# Patient Record
Sex: Male | Born: 1980 | Race: Black or African American | Hispanic: No | Marital: Single | Smoking: Current some day smoker
Health system: Southern US, Community
[De-identification: ages and names within clinical notes are randomized; demographics above are authoritative.]

## PROBLEM LIST (undated history)

## (undated) DIAGNOSIS — I10 Essential (primary) hypertension: Secondary | ICD-10-CM

---

## 1999-03-22 ENCOUNTER — Emergency Department (HOSPITAL_COMMUNITY): Admission: EM | Admit: 1999-03-22 | Discharge: 1999-03-22 | Payer: Self-pay | Admitting: Emergency Medicine

## 1999-11-27 ENCOUNTER — Emergency Department (HOSPITAL_COMMUNITY): Admission: EM | Admit: 1999-11-27 | Discharge: 1999-11-27 | Payer: Self-pay | Admitting: *Deleted

## 2001-08-16 ENCOUNTER — Emergency Department (HOSPITAL_COMMUNITY): Admission: EM | Admit: 2001-08-16 | Discharge: 2001-08-16 | Payer: Self-pay | Admitting: Emergency Medicine

## 2003-08-17 ENCOUNTER — Emergency Department (HOSPITAL_COMMUNITY): Admission: EM | Admit: 2003-08-17 | Discharge: 2003-08-17 | Payer: Self-pay | Admitting: Emergency Medicine

## 2005-05-04 ENCOUNTER — Emergency Department (HOSPITAL_COMMUNITY): Admission: EM | Admit: 2005-05-04 | Discharge: 2005-05-05 | Payer: Self-pay | Admitting: Emergency Medicine

## 2008-03-07 ENCOUNTER — Emergency Department (HOSPITAL_COMMUNITY): Admission: EM | Admit: 2008-03-07 | Discharge: 2008-03-07 | Payer: Self-pay | Admitting: Emergency Medicine

## 2008-09-09 ENCOUNTER — Emergency Department (HOSPITAL_COMMUNITY): Admission: EM | Admit: 2008-09-09 | Discharge: 2008-09-10 | Payer: Self-pay | Admitting: Emergency Medicine

## 2008-09-15 ENCOUNTER — Emergency Department (HOSPITAL_COMMUNITY): Admission: EM | Admit: 2008-09-15 | Discharge: 2008-09-16 | Payer: Self-pay | Admitting: Emergency Medicine

## 2010-08-14 ENCOUNTER — Emergency Department (HOSPITAL_COMMUNITY): Admission: EM | Admit: 2010-08-14 | Discharge: 2010-08-14 | Payer: Self-pay | Admitting: Emergency Medicine

## 2010-12-18 ENCOUNTER — Emergency Department (HOSPITAL_COMMUNITY)
Admission: EM | Admit: 2010-12-18 | Discharge: 2010-12-18 | Payer: Self-pay | Source: Home / Self Care | Admitting: Emergency Medicine

## 2010-12-19 ENCOUNTER — Emergency Department (HOSPITAL_COMMUNITY)
Admission: EM | Admit: 2010-12-19 | Discharge: 2010-12-19 | Payer: Self-pay | Source: Home / Self Care | Admitting: Emergency Medicine

## 2011-01-03 ENCOUNTER — Emergency Department (HOSPITAL_COMMUNITY)
Admission: EM | Admit: 2011-01-03 | Discharge: 2011-01-03 | Payer: Self-pay | Source: Home / Self Care | Admitting: Emergency Medicine

## 2012-12-05 ENCOUNTER — Encounter (HOSPITAL_BASED_OUTPATIENT_CLINIC_OR_DEPARTMENT_OTHER): Payer: Self-pay | Admitting: Family Medicine

## 2012-12-05 ENCOUNTER — Emergency Department (HOSPITAL_BASED_OUTPATIENT_CLINIC_OR_DEPARTMENT_OTHER): Payer: Medicaid Other

## 2012-12-05 ENCOUNTER — Emergency Department (HOSPITAL_BASED_OUTPATIENT_CLINIC_OR_DEPARTMENT_OTHER)
Admission: EM | Admit: 2012-12-05 | Discharge: 2012-12-05 | Disposition: A | Discharge status: Home / Self Care | Payer: Medicaid Other | Attending: Emergency Medicine | Admitting: Emergency Medicine

## 2012-12-05 DIAGNOSIS — I10 Essential (primary) hypertension: Secondary | ICD-10-CM | POA: Insufficient documentation

## 2012-12-05 DIAGNOSIS — G629 Polyneuropathy, unspecified: Secondary | ICD-10-CM

## 2012-12-05 DIAGNOSIS — F17200 Nicotine dependence, unspecified, uncomplicated: Secondary | ICD-10-CM | POA: Insufficient documentation

## 2012-12-05 DIAGNOSIS — M62838 Other muscle spasm: Secondary | ICD-10-CM | POA: Insufficient documentation

## 2012-12-05 DIAGNOSIS — G589 Mononeuropathy, unspecified: Secondary | ICD-10-CM | POA: Insufficient documentation

## 2012-12-05 DIAGNOSIS — R209 Unspecified disturbances of skin sensation: Secondary | ICD-10-CM | POA: Insufficient documentation

## 2012-12-05 HISTORY — DX: Essential (primary) hypertension: I10

## 2012-12-05 MED ORDER — GABAPENTIN 100 MG PO CAPS: 100.0000 mg | ORAL_CAPSULE | Freq: Three times a day (TID) | ORAL | Status: DC

## 2012-12-05 MED ORDER — IBUPROFEN 600 MG PO TABS: 600.0000 mg | ORAL_TABLET | Freq: Four times a day (QID) | ORAL | Status: DC | PRN

## 2012-12-05 MED ORDER — IBUPROFEN 400 MG PO TABS
600.0000 mg | ORAL_TABLET | Freq: Once | ORAL | Status: AC
  Administered 2012-12-05: 600 mg via ORAL
  Filled 2012-12-05: qty 1

## 2012-12-05 MED ORDER — METHOCARBAMOL 500 MG PO TABS
500.0000 mg | ORAL_TABLET | Freq: Once | ORAL | Status: AC
  Administered 2012-12-05: 500 mg via ORAL
  Filled 2012-12-05: qty 1

## 2012-12-05 MED ORDER — HYDROCODONE-ACETAMINOPHEN 5-325 MG PO TABS: 1.0000 | ORAL_TABLET | Freq: Four times a day (QID) | ORAL | Status: DC | PRN

## 2012-12-05 MED ORDER — DEXAMETHASONE SODIUM PHOSPHATE 10 MG/ML IJ SOLN
10.0000 mg | Freq: Once | INTRAMUSCULAR | Status: AC
  Administered 2012-12-05: 10 mg via INTRAMUSCULAR
  Filled 2012-12-05: qty 1

## 2012-12-05 MED ORDER — HYDROCODONE-ACETAMINOPHEN 5-325 MG PO TABS
ORAL_TABLET | ORAL | Status: AC
  Filled 2012-12-05: qty 1

## 2012-12-05 MED ORDER — HYDROCODONE-ACETAMINOPHEN 5-325 MG PO TABS
2.0000 | ORAL_TABLET | Freq: Once | ORAL | Status: AC
  Administered 2012-12-05: 2 via ORAL
  Filled 2012-12-05: qty 1

## 2012-12-05 MED ORDER — METHOCARBAMOL 500 MG PO TABS: 500.0000 mg | ORAL_TABLET | Freq: Two times a day (BID) | ORAL | Status: DC

## 2012-12-05 NOTE — ED Notes (Addendum)
Pt c/o left lateral neck pain for "weeks" that feels like "spasm" and "muscle pain" with tingling radiating down left arm.  and h/o same in past. Denies injury. Pt reports h/o htn without meds. No PCP.

## 2012-12-05 NOTE — ED Provider Notes (Signed)
History     CSN: [id number]  Arrival date & time 12/05/12  0908   First MD Initiated Contact with Patient 12/05/12 1019      Chief Complaint  Patient presents with  . Neck Pain    (Consider location/radiation/quality/duration/timing/severity/associated sxs/prior treatment) HPI Comments: Pt comes in with cc of neck pain and numbness in his left upper extremity. States that he had similar symptoms last year, and he was given a shot, and got better for close to a year- but his symptoms started few weeks back. Pt has neck pain, mostly in the paraspinal region, scapular region and he has shooting pain in his LUE with some numbness - described as pins and needle sensation. No hx of recent trauma, although he does engage in strenuous workouts. Pt has no associated weakness.  Patient is a 31 y.o. male presenting with neck pain. The history is provided by the patient.  Neck Pain  Associated symptoms include numbness. Pertinent negatives include no chest pain and no headaches.    Past Medical History  Diagnosis Date  . Hypertension     History reviewed. No pertinent past surgical history.  No family history on file.  History  Substance Use Topics  . Smoking status: Current Some Day Smoker  . Smokeless tobacco: Not on file  . Alcohol Use: Yes      Review of Systems  Constitutional: Negative for fever, chills and activity change.  HENT: Positive for neck pain.   Eyes: Negative for visual disturbance.  Respiratory: Negative for cough, chest tightness and shortness of breath.   Cardiovascular: Negative for chest pain.  Gastrointestinal: Negative for abdominal distention.  Genitourinary: Negative for dysuria, enuresis and difficulty urinating.  Musculoskeletal: Negative for arthralgias.  Neurological: Positive for numbness. Negative for dizziness, light-headedness and headaches.  Psychiatric/Behavioral: Negative for confusion.    Allergies  Review of patient's allergies  indicates no known allergies.  Home Medications  No current outpatient prescriptions on file.  BP 158/107  Pulse 89  Temp 98 F (36.7 C) (Oral)  Resp 18  Ht 5\' 8"  (1.727 m)  Wt 170 lb (77.111 kg)  BMI 25.85 kg/m2  SpO2 100%  Physical Exam  Constitutional: He is oriented to person, place, and time. He appears well-developed.  HENT:  Head: Normocephalic and atraumatic.  Eyes: Conjunctivae normal and EOM are normal. Pupils are equal, round, and reactive to light.  Neck: Normal range of motion. Neck supple.  Cardiovascular: Normal rate, regular rhythm and normal heart sounds.   Pulmonary/Chest: Effort normal and breath sounds normal. No respiratory distress. He has no wheezes.  Abdominal: Soft. Bowel sounds are normal. He exhibits no distension. There is no tenderness. There is no rebound and no guarding.  Neurological: He is alert and oriented to person, place, and time. No cranial nerve deficit. Coordination normal.       Sensory exam normal for bilateral upper and lower extremities - and patient is able to discriminate between sharp and dull - however, he has more subjective numbness the entire hand and also the forearm and arm. Motor exam is 4+/5  Skin: Skin is warm.    ED Course  Procedures (including critical care time)  Labs Reviewed - No data to display Ct Cervical Spine Wo Contrast  12/05/2012  *RADIOLOGY REPORT*  Clinical Data: Neck pain and left arm pain and numbness.  CT CERVICAL SPINE WITHOUT CONTRAST  Technique:  Multidetector CT imaging of the cervical spine was performed. Multiplanar CT image reconstructions were also  generated.  Comparison: Cervical spine CT 09/10/2008.  Findings: The sagittal reformatted images demonstrate normal alignment of the cervical vertebral bodies.  Disc spaces are maintained.  The facets are normally aligned.  No facet or laminar fractures.  No abnormal prevertebral soft tissue swelling.  No obvious large disc protrusions, spinal or  foraminal stenosis.  The lung apices are clear.  IMPRESSION: Unremarkable cervical spine CT scan.   Original Report Authenticated By: Rudie Meyer, M.D.      No diagnosis found.    MDM  Pt comes in with cc of neck pain and associated numbness in his left hand. He is right handed. No preceding trauma, no hx of neck injuries.  Initial impression is that patient is having nerve impingement. Unsure where the impingement is..... And we will get Ct scpine to ensure there is no sever stenosis of the neck. Pt has some neck spasms as well, which we will treat.  If no stenosis, Neuro follow up for possible nerve conduction study.   Derwood Kaplan, MD 12/05/12 1240

## 2012-12-25 ENCOUNTER — Encounter: Payer: Self-pay | Admitting: Neurology

## 2012-12-25 ENCOUNTER — Ambulatory Visit (INDEPENDENT_AMBULATORY_CARE_PROVIDER_SITE_OTHER): Payer: Medicaid Other | Admitting: Neurology

## 2012-12-25 VITALS — BP 134/90 | HR 70 | Temp 98.1°F | Resp 14 | Ht 68.0 in | Wt 167.0 lb

## 2012-12-25 DIAGNOSIS — M5412 Radiculopathy, cervical region: Secondary | ICD-10-CM | POA: Insufficient documentation

## 2012-12-25 MED ORDER — METHYLPREDNISOLONE 4 MG PO KIT: PACK | ORAL | Status: DC

## 2012-12-25 NOTE — Patient Instructions (Addendum)
Call us in about 10 days and let us know how you are doing.   [phone]
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
## 2013-01-01 ENCOUNTER — Telehealth: Payer: Self-pay | Admitting: Neurology

## 2013-01-01 NOTE — Telephone Encounter (Signed)
The patient left voicemail on office phone stating he had received a telephone call from our office.  Please call the patient at [phone].

## 2013-01-01 NOTE — Telephone Encounter (Signed)
See phone note dated 01/01/13

## 2013-01-01 NOTE — Telephone Encounter (Signed)
Cira Servant., MD - neck pain arm pain More Detail >>     Sent: Wed January 01, 2013  1:27 PM    To: Benay Spice, RN       Pt Home: [phone]        Message     I tried calling cell phone and left a message.         Could you follow up with inquiry as to how are the symptoms doing with the medrol dose pack?         We can follow up if needed and/or try PT if indicated      Livia Snellen 01/01/2013 1:29 PM Signed  The patient left voicemail on office phone stating he had received a telephone call from our office. Please call the patient at [phone].  I left a message for the patient to return my call.  jhs 01/01/13 @ 1440

## 2013-01-06 NOTE — Telephone Encounter (Signed)
Called and spoke with the patient. He reports that the steroids "dulled the pain it a little." I suggested a follow up appointment and/or PT and the patient is interested in trying therapy. I told him I would check with Dr. Smiley Houseman and see what he recommended and get back with him with that information. The patient is ok with this plan. **Dr. Smiley Houseman, please advise therapy recommendations. Thanks.

## 2013-01-08 ENCOUNTER — Other Ambulatory Visit: Payer: Self-pay | Admitting: Neurology

## 2013-01-08 DIAGNOSIS — M79602 Pain in left arm: Secondary | ICD-10-CM

## 2013-01-08 DIAGNOSIS — M5412 Radiculopathy, cervical region: Secondary | ICD-10-CM

## 2013-01-08 NOTE — Telephone Encounter (Signed)
Lets recommend physical therapy twice a week for 3 to 4 weeks, Then 1 month follow up.

## 2013-01-08 NOTE — Telephone Encounter (Signed)
Called the patient. Aware of referral to PT and that they will call him to schedule appointment.

## 2013-01-17 ENCOUNTER — Ambulatory Visit: Payer: Medicaid Other

## 2013-01-17 ENCOUNTER — Ambulatory Visit: Payer: Medicaid Other | Admitting: Physical Therapy

## 2013-01-24 ENCOUNTER — Ambulatory Visit: Payer: Medicaid Other | Attending: Neurology | Admitting: Physical Therapy

## 2013-01-24 DIAGNOSIS — M25519 Pain in unspecified shoulder: Secondary | ICD-10-CM | POA: Insufficient documentation

## 2013-01-24 DIAGNOSIS — M542 Cervicalgia: Secondary | ICD-10-CM | POA: Insufficient documentation

## 2013-01-24 DIAGNOSIS — IMO0001 Reserved for inherently not codable concepts without codable children: Secondary | ICD-10-CM | POA: Insufficient documentation

## 2013-01-29 ENCOUNTER — Ambulatory Visit: Payer: Medicaid Other | Admitting: Physical Therapy

## 2013-02-03 ENCOUNTER — Ambulatory Visit: Payer: Medicaid Other | Admitting: Rehabilitative and Restorative Service Providers"

## 2013-02-17 ENCOUNTER — Ambulatory Visit: Payer: Medicaid Other | Admitting: Physical Therapy

## 2013-02-18 ENCOUNTER — Ambulatory Visit: Payer: Medicaid Other | Attending: Neurology | Admitting: Physical Therapy

## 2013-02-18 DIAGNOSIS — M542 Cervicalgia: Secondary | ICD-10-CM | POA: Insufficient documentation

## 2013-02-18 DIAGNOSIS — M25519 Pain in unspecified shoulder: Secondary | ICD-10-CM | POA: Insufficient documentation

## 2013-02-18 DIAGNOSIS — IMO0001 Reserved for inherently not codable concepts without codable children: Secondary | ICD-10-CM | POA: Insufficient documentation

## 2013-02-24 ENCOUNTER — Ambulatory Visit: Payer: Medicaid Other | Admitting: Physical Therapy

## 2013-03-05 ENCOUNTER — Ambulatory Visit: Payer: Medicaid Other | Admitting: Physical Therapy

## 2013-11-24 ENCOUNTER — Encounter (HOSPITAL_BASED_OUTPATIENT_CLINIC_OR_DEPARTMENT_OTHER): Payer: Self-pay | Admitting: Emergency Medicine

## 2013-11-24 ENCOUNTER — Emergency Department (HOSPITAL_BASED_OUTPATIENT_CLINIC_OR_DEPARTMENT_OTHER)
Admission: EM | Admit: 2013-11-24 | Discharge: 2013-11-24 | Disposition: A | Discharge status: Home / Self Care | Payer: Medicaid Other | Attending: Emergency Medicine | Admitting: Emergency Medicine

## 2013-11-24 ENCOUNTER — Emergency Department (HOSPITAL_BASED_OUTPATIENT_CLINIC_OR_DEPARTMENT_OTHER): Payer: Medicaid Other

## 2013-11-24 DIAGNOSIS — IMO0002 Reserved for concepts with insufficient information to code with codable children: Secondary | ICD-10-CM | POA: Insufficient documentation

## 2013-11-24 DIAGNOSIS — Z79899 Other long term (current) drug therapy: Secondary | ICD-10-CM | POA: Insufficient documentation

## 2013-11-24 DIAGNOSIS — I10 Essential (primary) hypertension: Secondary | ICD-10-CM | POA: Insufficient documentation

## 2013-11-24 DIAGNOSIS — M549 Dorsalgia, unspecified: Secondary | ICD-10-CM | POA: Insufficient documentation

## 2013-11-24 DIAGNOSIS — F17200 Nicotine dependence, unspecified, uncomplicated: Secondary | ICD-10-CM | POA: Insufficient documentation

## 2013-11-24 MED ORDER — CYCLOBENZAPRINE HCL 5 MG PO TABS: 5.0000 mg | ORAL_TABLET | Freq: Two times a day (BID) | ORAL | Status: DC | PRN

## 2013-11-24 MED ORDER — HYDROCODONE-ACETAMINOPHEN 5-325 MG PO TABS
1.0000 | ORAL_TABLET | Freq: Once | ORAL | Status: AC
  Administered 2013-11-24: 1 via ORAL
  Filled 2013-11-24: qty 1

## 2013-11-24 MED ORDER — CYCLOBENZAPRINE HCL 10 MG PO TABS
5.0000 mg | ORAL_TABLET | Freq: Once | ORAL | Status: AC
  Administered 2013-11-24: 5 mg via ORAL
  Filled 2013-11-24: qty 1

## 2013-11-24 MED ORDER — HYDROCODONE-ACETAMINOPHEN 5-325 MG PO TABS: 1.0000 | ORAL_TABLET | ORAL | Status: DC | PRN

## 2013-11-24 NOTE — ED Notes (Signed)
Pt rpeorts back pain that started this am.  Pain is described as sharp, increases with breathing and located "under shoulder blade" radiating to left chest wall.

## 2013-11-24 NOTE — ED Provider Notes (Signed)
CSN: [id number]     Arrival date & time 11/24/13  1021 History   First MD Initiated Contact with Patient 11/24/13 1035     Chief Complaint  Patient presents with  . Back Pain   (Consider location/radiation/quality/duration/timing/severity/associated sxs/prior Treatment) HPI Comments: Pt states that he was at work and she started having pain on his left shoulder blade:no numbness or weakness:pt states that he has a history of muscle spasms and this feels similar:no recent know injury  Patient is a 32 y.o. male presenting with back pain. The history is provided by the patient. No language interpreter was used.  Back Pain Location:  Thoracic spine Quality:  Aching Radiates to:  Does not radiate Pain severity:  Moderate Pain is:  Same all the time Onset quality:  Sudden Timing:  Constant   Past Medical History  Diagnosis Date  . Hypertension    History reviewed. No pertinent past surgical history. No family history on file. History  Substance Use Topics  . Smoking status: Current Some Day Smoker  . Smokeless tobacco: Never Used     Comment: social smoker  . Alcohol Use: Yes     Comment: 2-3 times a week    Review of Systems  Constitutional: Negative.   Respiratory: Negative.   Cardiovascular: Negative.   Musculoskeletal: Positive for back pain.    Allergies  Review of patient's allergies indicates no known allergies.  Home Medications   Current Outpatient Rx  Name  Route  Sig  Dispense  Refill  . gabapentin (NEURONTIN) 100 MG capsule   Oral   Take 1 capsule (100 mg total) by mouth 3 (three) times daily.   60 capsule   0   . HYDROcodone-acetaminophen (NORCO/VICODIN) 5-325 MG per tablet   Oral   Take 1 tablet by mouth every 6 (six) hours as needed for pain (severe pain only).   15 tablet   0   . ibuprofen (ADVIL,MOTRIN) 600 MG tablet   Oral   Take 1 tablet (600 mg total) by mouth every 6 (six) hours as needed for pain.   30 tablet   0   . methocarbamol  (ROBAXIN) 500 MG tablet   Oral   Take 1 tablet (500 mg total) by mouth 2 (two) times daily.   20 tablet   0   . methylPREDNISolone (MEDROL DOSEPAK) 4 MG tablet      follow package directions   21 tablet   0    BP 162/102  Pulse 71  Temp(Src) 98.1 F (36.7 C) (Oral)  Resp 16  SpO2 100% Physical Exam  Nursing note and vitals reviewed. Constitutional: He is oriented to person, place, and time. He appears well-developed and well-nourished.  Cardiovascular: Normal rate and regular rhythm.   Pulmonary/Chest: Effort normal and breath sounds normal.  Musculoskeletal: Normal range of motion.  Pt tender under the left shoulder blade:no deformity noted  Neurological: He is oriented to person, place, and time. Coordination normal.  Skin: Skin is warm and dry.    ED Course  Procedures (including critical care time) Labs Review Labs Reviewed - No data to display Imaging Review Dg Chest 2 View  11/24/2013   CLINICAL DATA:  Chest pain, smoker  EXAM: CHEST  2 VIEW  COMPARISON:  09/10/2008  FINDINGS: Cardiomediastinal silhouette is stable. No acute infiltrate or pleural effusion. No pulmonary edema. Bony thorax is unremarkable.  IMPRESSION: No active cardiopulmonary disease.   Electronically Signed   By: Natasha Mead M.D.   On:  11/24/2013 11:05    EKG Interpretation   None       MDM   1. Back pain    No injury:likely musculoskeletal in nature:will treat with vicodin and flexeril   Teressa Lower, NP 11/24/13 1207

## 2013-11-24 NOTE — ED Provider Notes (Signed)
Medical screening examination/treatment/procedure(s) were performed by non-physician practitioner and as supervising physician I was immediately available for consultation/collaboration.  EKG Interpretation   None         Candyce Churn, MD 11/24/13 1410

## 2016-08-08 ENCOUNTER — Emergency Department (HOSPITAL_COMMUNITY)
Admission: EM | Admit: 2016-08-08 | Discharge: 2016-08-08 | Disposition: A | Discharge status: Home / Self Care | Payer: Medicaid Other | Source: Home / Self Care

## 2016-08-08 ENCOUNTER — Encounter (HOSPITAL_BASED_OUTPATIENT_CLINIC_OR_DEPARTMENT_OTHER): Payer: Self-pay

## 2016-08-08 ENCOUNTER — Emergency Department (HOSPITAL_BASED_OUTPATIENT_CLINIC_OR_DEPARTMENT_OTHER): Payer: Medicaid Other

## 2016-08-08 ENCOUNTER — Emergency Department (HOSPITAL_BASED_OUTPATIENT_CLINIC_OR_DEPARTMENT_OTHER)
Admission: EM | Admit: 2016-08-08 | Discharge: 2016-08-09 | Disposition: A | Discharge status: Home / Self Care | Payer: Medicaid Other | Attending: Physician Assistant | Admitting: Physician Assistant

## 2016-08-08 ENCOUNTER — Encounter (HOSPITAL_COMMUNITY): Payer: Self-pay

## 2016-08-08 DIAGNOSIS — Z5321 Procedure and treatment not carried out due to patient leaving prior to being seen by health care provider: Secondary | ICD-10-CM

## 2016-08-08 DIAGNOSIS — F17200 Nicotine dependence, unspecified, uncomplicated: Secondary | ICD-10-CM

## 2016-08-08 DIAGNOSIS — R600 Localized edema: Secondary | ICD-10-CM | POA: Insufficient documentation

## 2016-08-08 DIAGNOSIS — I10 Essential (primary) hypertension: Secondary | ICD-10-CM

## 2016-08-08 DIAGNOSIS — R609 Edema, unspecified: Secondary | ICD-10-CM

## 2016-08-08 DIAGNOSIS — M7989 Other specified soft tissue disorders: Secondary | ICD-10-CM

## 2016-08-08 MED ORDER — IBUPROFEN 400 MG PO TABS
400.0000 mg | ORAL_TABLET | Freq: Once | ORAL | Status: AC | PRN
  Administered 2016-08-08: 400 mg via ORAL

## 2016-08-08 MED ORDER — OXYCODONE-ACETAMINOPHEN 5-325 MG PO TABS: 1.0000 | ORAL_TABLET | Freq: Four times a day (QID) | ORAL | 0 refills | Status: DC | PRN

## 2016-08-08 MED ORDER — CEPHALEXIN 500 MG PO CAPS: 500.0000 mg | ORAL_CAPSULE | Freq: Four times a day (QID) | ORAL | 0 refills | Status: DC

## 2016-08-08 MED ORDER — IBUPROFEN 400 MG PO TABS
ORAL_TABLET | ORAL | Status: AC
  Filled 2016-08-08: qty 1

## 2016-08-08 MED ORDER — OXYCODONE-ACETAMINOPHEN 5-325 MG PO TABS
1.0000 | ORAL_TABLET | Freq: Once | ORAL | Status: AC
  Administered 2016-08-08: 1 via ORAL
  Filled 2016-08-08: qty 1

## 2016-08-08 NOTE — Discharge Instructions (Signed)
Please establish care with a primary care physician. You have been given the phone number in the paperwork to help you with finding a physician. You can also call [facility] at [phone].   We are unsure what is causing your edema. We will need you to raise both her legs and rest. If the redness gets worse  please start to take antibiotics that were prescribed. It could be a superficial infection in the skin, though likely it is just from standing for long periods. We want you to follow up with a aPCP as soon as possible.

## 2016-08-08 NOTE — ED Provider Notes (Signed)
[facility] Provider Note   CSN: [id number] Arrival date & time: 08/08/16  [id number] By signing my name below, I, Bridgette HabermannMaria Tan, attest that this documentation has been prepared under the direction and in the presence of Alejandria Wessells Randall AnLyn Ulices Maack, MD. Electronically Signed: Bridgette HabermannMaria Tan, ED Scribe. 08/08/16. 7:40 PM.  History   Chief Complaint Chief Complaint  Patient presents with  . Leg Swelling   HPI Comments: Glenn Weber is a 35 y.o. male with h/o HTN who presents to the Emergency Department complaining of sudden onset, constant right lower extremity pain and swelling onset 5 days ago. Pt states he was working at a [employer] earlier this week and he was standing for long periods of time. He notes there is discoloration and states pain is exacerbated with ambulating and standing. Pt reports he could "feel the fluid". No alleviating factors tried PTA. Denies fever.   The history is provided by the patient. No language interpreter was used.    Past Medical History:  Diagnosis Date  . Hypertension     Patient Active Problem List   Diagnosis Date Noted  . Cervical radiculopathy at C7 12/25/2012    History reviewed. No pertinent surgical history.   Home Medications    Prior to Admission medications   Medication Sig Start Date End Date Taking? Authorizing Provider  cephALEXin (KEFLEX) 500 MG capsule Take 1 capsule (500 mg total) by mouth 4 (four) times daily. 08/08/16   Ojas Coone Lyn Vance Hochmuth, MD  cyclobenzaprine (FLEXERIL) 5 MG tablet Take 1 tablet (5 mg total) by mouth 2 (two) times daily as needed for muscle spasms. 11/24/13   Teressa LowerVrinda Pickering, NP  gabapentin (NEURONTIN) 100 MG capsule Take 1 capsule (100 mg total) by mouth 3 (three) times daily. 12/05/12   Derwood KaplanAnkit Nanavati, MD  HYDROcodone-acetaminophen (NORCO/VICODIN) 5-325 MG per tablet Take 1 tablet by mouth every 6 (six) hours as needed for pain (severe pain only). 12/05/12   Derwood KaplanAnkit Nanavati, MD    HYDROcodone-acetaminophen (NORCO/VICODIN) 5-325 MG per tablet Take 1-2 tablets by mouth every 4 (four) hours as needed. 11/24/13   Teressa LowerVrinda Pickering, NP  ibuprofen (ADVIL,MOTRIN) 600 MG tablet Take 1 tablet (600 mg total) by mouth every 6 (six) hours as needed for pain. 12/05/12   Derwood KaplanAnkit Nanavati, MD  methocarbamol (ROBAXIN) 500 MG tablet Take 1 tablet (500 mg total) by mouth 2 (two) times daily. 12/05/12   Derwood KaplanAnkit Nanavati, MD  methylPREDNISolone (MEDROL DOSEPAK) 4 MG tablet follow package directions 12/25/12   Cira ServantMichael L Soo, MD    Family History No family history on file.  Social History Social History  Substance Use Topics  . Smoking status: Current Some Day Smoker  . Smokeless tobacco: Never Used     Comment: social smoker  . Alcohol use Yes     Comment: 2-3 times a week     Allergies   Review of patient's allergies indicates no known allergies.   Review of Systems Review of Systems  Constitutional: Negative for fever.  Cardiovascular: Positive for leg swelling.  Skin: Positive for color change.  All other systems reviewed and are negative.  Physical Exam Updated Vital Signs BP (!) 157/107 (BP Location: Left Arm)   Pulse 82   Temp 98.5 F (36.9 C) (Oral)   Resp 18   Ht 5\' 8"  (1.727 m)   Wt 175 lb (79.4 kg)   SpO2 98%   BMI 26.61 kg/m   Physical Exam  Constitutional: He appears well-developed and well-nourished.  HENT:  Head: Normocephalic.  Eyes: Conjunctivae are normal.  Cardiovascular: Normal rate, regular rhythm and normal heart sounds.  Exam reveals no gallop and no friction rub.   No murmur heard. Pulmonary/Chest: Effort normal and breath sounds normal. No respiratory distress. He has no wheezes. He has no rales.  Abdominal: He exhibits no distension.  Musculoskeletal: Normal range of motion. He exhibits edema and tenderness.  Mild swelling to the right ankle with mild erythema to the lateral aspect of his right ankle and foot. No joint pain with pass over  active motion.   Neurological: He is alert.  Skin: Skin is warm and dry.  Psychiatric: He has a normal mood and affect. His behavior is normal.  Nursing note and vitals reviewed.  ED Treatments / Results  DIAGNOSTIC STUDIES: Oxygen Saturation is 100% on RA, normal by my interpretation.    COORDINATION OF CARE: 7:39 PM Discussed treatment plan with pt at bedside which includes ultrasound and x-ray and pt agreed to plan.  Labs (all labs ordered are listed, but only abnormal results are displayed) Labs Reviewed - No data to display  EKG  EKG Interpretation None       Radiology Dg Ankle 2 Views Right  Result Date: 08/08/2016 CLINICAL DATA:  Right ankle pain with swelling and redness. Symptoms for 3 days. No known injury. EXAM: RIGHT ANKLE - 2 VIEW COMPARISON:  None. FINDINGS: There is no evidence of fracture, dislocation, or joint effusion. There is no evidence of arthropathy or other focal bone abnormality. There is diffuse soft tissue edema. IMPRESSION: Diffuse soft tissue edema.  No osseous abnormality. Electronically Signed   By: Rubye Oaks M.D.   On: 08/08/2016 21:05   US Venous Img Lower Bilateral  Result Date: 08/08/2016 CLINICAL DATA:  Bilateral leg swelling and mild erythema. EXAM: BILATERAL LOWER EXTREMITY VENOUS DOPPLER ULTRASOUND TECHNIQUE: Gray-scale sonography with graded compression, as well as color Doppler and duplex ultrasound were performed to evaluate the lower extremity deep venous systems from the level of the common femoral vein and including the common femoral, femoral, profunda femoral, popliteal and calf veins including the posterior tibial, peroneal and gastrocnemius veins when visible. The superficial great saphenous vein was also interrogated. Spectral Doppler was utilized to evaluate flow at rest and with distal augmentation maneuvers in the common femoral, femoral and popliteal veins. COMPARISON:  None. FINDINGS: RIGHT LOWER EXTREMITY Common Femoral  Vein: No evidence of thrombus. Normal compressibility, respiratory phasicity and response to augmentation. Saphenofemoral Junction: No evidence of thrombus. Profunda Femoral Vein: No evidence of thrombus. Femoral Vein: No evidence of thrombus. Popliteal Vein: No evidence of thrombus. Calf Veins: No evidence of thrombus. Superficial Great Saphenous Vein: No evidence of thrombus. LEFT LOWER EXTREMITY Common Femoral Vein: No evidence of thrombus. Saphenofemoral Junction: No evidence of thrombus. Profunda Femoral Vein: No evidence of thrombus. Femoral Vein: No evidence of thrombus. Popliteal Vein: No evidence of thrombus. Calf Veins: No evidence of thrombus. Superficial Great Saphenous Vein: No evidence of thrombus. IMPRESSION: Negative for deep venous thrombosis in both lower extremities. Electronically Signed   By: Marnee Spring M.D.   On: 08/08/2016 22:10    Procedures Procedures (including critical care time)  Medications Ordered in ED Medications  oxyCODONE-acetaminophen (PERCOCET/ROXICET) 5-325 MG per tablet 1 tablet (1 tablet Oral Given 08/08/16 2013)     Initial Impression / Assessment and Plan / ED Course  I have reviewed the triage vital signs and the nursing notes.  Pertinent labs & imaging results that were available during  my care of the patient were reviewed by me and considered in my medical decision making (see chart for details).  Clinical Course   Patient is a healthy 35 year old African American male presenting with leg swelling. Patient has been working an event where he stood a lot, over 12 hours a day for the last 3 days. Today he found his legs to be very swollen. He also had some mild erythema and warmth to the right foot. We will get US to make sure there is no clot. However suspect this is likely due to venous insufficiency from standing and heat. Doubt setpic arthritis- no pain with movement of joint.  Doubt gout from visualization and no pain in joint. Could be beginning  on cellulitis in the right foot. No SOB or rales on exam and the swelling improves with raising the feet so doubt cardiac etiology.    10:42 PM Negative findings. Will give watch and wait rx for cellulitis. Only mild erythema.  Patient insturcted to start abx if rest and elevation does not compeltely resolve symptoms.    Final Clinical Impressions(s) / ED Diagnoses   Final diagnoses:  Peripheral edema    New Prescriptions New Prescriptions   CEPHALEXIN (KEFLEX) 500 MG CAPSULE    Take 1 capsule (500 mg total) by mouth 4 (four) times daily.  I personally performed the services described in this documentation, which was scribed in my presence. The recorded information has been reviewed and is accurate.       Sheniah Supak Randall AnLyn Jari Carollo, MD 08/08/16 [id number]

## 2016-08-08 NOTE — ED Triage Notes (Signed)
Pt states he wants to leave at this time. RN advised pt of risks of leaving. Pt understands and wishes to leave.

## 2016-08-08 NOTE — ED Triage Notes (Signed)
Pt worked the [employer], onset 4 nights ago bilateral feet started hurting, swelling and now right foot is red on top of foot and lateral side of foot and lower leg.  Pt reports left leg swollen too but not noticeable.

## 2016-08-08 NOTE — ED Triage Notes (Signed)
C/o bilat LE swelling x 5 days-NAD-presents to tri[facility] in a [facility]

## 2016-08-16 ENCOUNTER — Inpatient Hospital Stay: Payer: Medicaid Other | Admitting: Internal Medicine

## 2018-11-23 ENCOUNTER — Emergency Department (HOSPITAL_BASED_OUTPATIENT_CLINIC_OR_DEPARTMENT_OTHER): Payer: Medicaid Other

## 2018-11-23 ENCOUNTER — Emergency Department (HOSPITAL_BASED_OUTPATIENT_CLINIC_OR_DEPARTMENT_OTHER)
Admission: EM | Admit: 2018-11-23 | Discharge: 2018-11-23 | Disposition: A | Discharge status: Home / Self Care | Payer: Medicaid Other | Attending: Emergency Medicine | Admitting: Emergency Medicine

## 2018-11-23 ENCOUNTER — Other Ambulatory Visit: Payer: Self-pay

## 2018-11-23 ENCOUNTER — Encounter (HOSPITAL_BASED_OUTPATIENT_CLINIC_OR_DEPARTMENT_OTHER): Payer: Self-pay | Admitting: *Deleted

## 2018-11-23 DIAGNOSIS — Z79899 Other long term (current) drug therapy: Secondary | ICD-10-CM | POA: Insufficient documentation

## 2018-11-23 DIAGNOSIS — J069 Acute upper respiratory infection, unspecified: Secondary | ICD-10-CM

## 2018-11-23 DIAGNOSIS — F17210 Nicotine dependence, cigarettes, uncomplicated: Secondary | ICD-10-CM | POA: Insufficient documentation

## 2018-11-23 DIAGNOSIS — I10 Essential (primary) hypertension: Secondary | ICD-10-CM | POA: Diagnosis not present

## 2018-11-23 DIAGNOSIS — R0981 Nasal congestion: Secondary | ICD-10-CM | POA: Diagnosis present

## 2018-11-23 DIAGNOSIS — B9789 Other viral agents as the cause of diseases classified elsewhere: Secondary | ICD-10-CM | POA: Diagnosis not present

## 2018-11-23 MED ORDER — FLUTICASONE PROPIONATE 50 MCG/ACT NA SUSP: 1.0000 | Freq: Every day | NASAL | 0 refills | Status: DC

## 2018-11-23 MED ORDER — NAPROXEN 500 MG PO TABS: 500.0000 mg | ORAL_TABLET | Freq: Two times a day (BID) | ORAL | 0 refills | Status: DC

## 2018-11-23 MED ORDER — BENZONATATE 100 MG PO CAPS: 100.0000 mg | ORAL_CAPSULE | Freq: Three times a day (TID) | ORAL | 0 refills | Status: DC

## 2018-11-23 NOTE — Discharge Instructions (Signed)
You were seen in the emergency today for upper respiratory symptoms, we suspect your symptoms are related to allergies or a virus at this time. Your chest xray did not show pneumonia, it did have some changes related to smoking, please try to quit or cut down on tobacco use. I have prescribed you multiple medications to treat your symptoms.   -Flonase to be used 1 spray in each nostril daily.  This medication is used to treat your congestion.  -Tessalon can be taken once every 8 hours as needed.  This medication is used to treat your cough.  - Naproxen is a nonsteroidal anti-inflammatory medication that will help with pain and swelling. Be sure to take this medication as prescribed with food, 1 pill every 12 hours,  It should be taken with food, as it can cause stomach upset, and more seriously, stomach bleeding. Do not take other nonsteroidal anti-inflammatory medications with this such as Advil, Motrin, Aleve, Mobic, Goodie Powder, or Motrin.    You make take Tylenol per over the counter dosing with these medications.   We have prescribed you new medication(s) today. Discuss the medications prescribed today with your pharmacist as they can have adverse effects and interactions with your other medicines including over the counter and prescribed medications. Seek medical evaluation if you start to experience new or abnormal symptoms after taking one of these medicines, seek care immediately if you start to experience difficulty breathing, feeling of your throat closing, facial swelling, or rash as these could be indications of a more serious allergic reaction   You will need to follow-up with your primary care provider in 1 week if your symptoms have not improved.  If you do not have a primary care provider one is provided in your discharge instructions.  Return to the emergency department for any new or worsening symptoms including but not limited to persistent fever for 5 days, difficulty breathing,  chest pain, rashes, passing out, or any other concerns.

## 2018-11-23 NOTE — ED Triage Notes (Signed)
Pt c/o cough, chills, congestion since Thanksgiving

## 2018-11-23 NOTE — ED Provider Notes (Signed)
[facility] EMERGENCY DEPARTMENT Provider Note   CSN: [id number] Arrival date & time: 11/23/18  1639     History   Chief Complaint Chief Complaint  Patient presents with  . Nasal Congestion    HPI Glenn Weber is a 37 y.o. male with a hx of tobacco abuse and HTN who presents to the ED with URI sxs for the past 7-10 days. Patient reports congestion, rhinorrhea, sore throat, productive cough with green mucous sputum, as well as sweats/chills, no measured fevers at home. He has tried multiple OTC medicines at home including theraflu, tylenol, and mucinex without relief. + sick contacts w/ similar. Denies vomiting, diarrhea, abdominal pain, chest pain, or dyspnea.   HPI  Past Medical History:  Diagnosis Date  . Hypertension     Patient Active Problem List   Diagnosis Date Noted  . Cervical radiculopathy at C7 12/25/2012    History reviewed. No pertinent surgical history.      Home Medications    Prior to Admission medications   Medication Sig Start Date End Date Taking? Authorizing Provider  cephALEXin (KEFLEX) 500 MG capsule Take 1 capsule (500 mg total) by mouth 4 (four) times daily. 08/08/16   Mackuen, Courteney Lyn, MD  cyclobenzaprine (FLEXERIL) 5 MG tablet Take 1 tablet (5 mg total) by mouth 2 (two) times daily as needed for muscle spasms. 11/24/13   Teressa Lower, NP  gabapentin (NEURONTIN) 100 MG capsule Take 1 capsule (100 mg total) by mouth 3 (three) times daily. 12/05/12   Derwood Kaplan, MD  HYDROcodone-acetaminophen (NORCO/VICODIN) 5-325 MG per tablet Take 1 tablet by mouth every 6 (six) hours as needed for pain (severe pain only). 12/05/12   Derwood Kaplan, MD  HYDROcodone-acetaminophen (NORCO/VICODIN) 5-325 MG per tablet Take 1-2 tablets by mouth every 4 (four) hours as needed. 11/24/13   Teressa Lower, NP  ibuprofen (ADVIL,MOTRIN) 600 MG tablet Take 1 tablet (600 mg total) by mouth every 6 (six) hours as needed for pain. 12/05/12   Derwood Kaplan, MD  methocarbamol (ROBAXIN) 500 MG tablet Take 1 tablet (500 mg total) by mouth 2 (two) times daily. 12/05/12   Derwood Kaplan, MD  methylPREDNISolone (MEDROL DOSEPAK) 4 MG tablet follow package directions 12/25/12   Cira Servant, MD  oxyCODONE-acetaminophen (PERCOCET/ROXICET) 5-325 MG tablet Take 1 tablet by mouth every 6 (six) hours as needed for severe pain. 08/08/16   Mackuen, Cindee Salt, MD    Family History No family history on file.  Social History Social History   Tobacco Use  . Smoking status: Current Some Day Smoker    Types: Cigarettes  . Smokeless tobacco: Never Used  . Tobacco comment: social smoker  Substance Use Topics  . Alcohol use: Yes    Comment: 2-3 times a week  . Drug use: Not Currently     Allergies   Patient has no known allergies.   Review of Systems Review of Systems  Constitutional: Positive for chills. Negative for fever.  HENT: Positive for congestion, rhinorrhea and sore throat. Negative for ear pain, trouble swallowing and voice change.   Respiratory: Positive for cough. Negative for shortness of breath.   Cardiovascular: Negative for chest pain.  Gastrointestinal: Negative for abdominal pain, diarrhea and vomiting.    Physical Exam Updated Vital Signs BP (!) 162/108 (BP Location: Left Arm)   Pulse (!) 108   Temp 98.3 F (36.8 C) (Oral)   Resp 18   Ht 5\' 9"  (1.753 m)   Wt 80.3 kg  SpO2 98%   BMI 26.14 kg/m   Physical Exam  Constitutional: He appears well-developed and well-nourished.  Non-toxic appearance. No distress.  HENT:  Head: Normocephalic and atraumatic.  Right Ear: Tympanic membrane and ear canal normal. Tympanic membrane is not erythematous, not retracted and not bulging.  Left Ear: Tympanic membrane and ear canal normal. Tympanic membrane is not erythematous, not retracted and not bulging.  Nose: Mucosal edema present. Right sinus exhibits no maxillary sinus tenderness and no frontal sinus tenderness. Left  sinus exhibits no maxillary sinus tenderness and no frontal sinus tenderness.  Mouth/Throat: Uvula is midline and oropharynx is clear and moist. No oropharyngeal exudate.  Posterior oropharynx is symmetric appearing. Patient tolerating own secretions without difficulty. No trismus. No drooling. No hot potato voice. No swelling beneath the tongue, submandibular compartment is soft.   Eyes: Pupils are equal, round, and reactive to light. Conjunctivae and EOM are normal. Right eye exhibits no discharge. Left eye exhibits no discharge.  Neck: Normal range of motion. Neck supple. No neck rigidity. No edema and no erythema present.  Cardiovascular: Normal rate and regular rhythm.  Pulmonary/Chest: Effort normal and breath sounds normal. No respiratory distress. He has no wheezes. He has no rhonchi. He has no rales.  Respiration even and unlabored  Abdominal: Soft. He exhibits no distension. There is no tenderness.  Neurological: He is alert.  Clear speech.   Skin: Skin is warm and dry. No rash noted.  Psychiatric: He has a normal mood and affect. His behavior is normal.  Nursing note and vitals reviewed.   ED Treatments / Results  Labs (all labs ordered are listed, but only abnormal results are displayed) Labs Reviewed - No data to display  EKG None  Radiology Dg Chest 2 View  Result Date: 11/23/2018 CLINICAL DATA:  Cough and congestion for 9 days. EXAM: CHEST - 2 VIEW COMPARISON:  11/24/2013 FINDINGS: The heart size and mediastinal contours are within normal limits. Both lungs are clear. Mild peribronchial thickening could be related to smoking or mild bronchitis. The visualized skeletal structures are unremarkable. IMPRESSION: Mild peribronchial thickening likely related to smoking or possible bronchitis. No pulmonary infiltrates Electronically Signed   By: Rudie MeyerP.  Gallerani M.D.   On: 11/23/2018 18:45    Procedures Procedures (including critical care time)  Medications Ordered in  ED Medications - No data to display   Initial Impression / Assessment and Plan / ED Course  I have reviewed the triage vital signs and the nursing notes.  Pertinent labs & imaging results that were available during my care of the patient were reviewed by me and considered in my medical decision making (see chart for details).    Patient presents with URI type symptoms.  Patient is nontoxic appearing, in no apparent distress, initial tachycardia normalized, HTN noted- doubt HTN emergency. Patient is afebrile in the ED, lungs are CTA, CXR negative for infiltrate, doubt pneumonia. There is no wheezing or signs of respiratory distress to indicate inhaler or steroids. Afebrile, no sinus tenderness, doubt acute bacterial sinusitis. Centor score 0, doubt strep pharyngitis. No evidence of AOM on exam. No meningeal signs. Suspect viral vs allergic etiology at this time, favor viral, and will treat supportively with naproxen, Flonase, and Tessalon. I discussed results, treatment plan, need for PCP follow-up, and return precautions with the patient. Provided opportunity for questions, patient confirmed understanding and is in agreement with plan.    Vitals:   11/23/18 1658 11/23/18 1930  BP: (!) 162/108   Pulse: Marland Kitchen(!)  108 85  Resp: 18 18  Temp: 98.3 F (36.8 C) 98.5 F (36.9 C)  SpO2: 98% 100%    Final Clinical Impressions(s) / ED Diagnoses   Final diagnoses:  Viral URI with cough    ED Discharge Orders         Ordered    naproxen (NAPROSYN) 500 MG tablet  2 times daily     11/23/18 1922    fluticasone (FLONASE) 50 MCG/ACT nasal spray  Daily     11/23/18 1922    benzonatate (TESSALON) 100 MG capsule  Every 8 hours     11/23/18 [address], [place], [place] 11/23/18 1950    Arby Barrette, MD 11/28/18 [phone]

## 2019-11-06 ENCOUNTER — Encounter: Payer: Self-pay | Admitting: Family Medicine

## 2020-03-18 ENCOUNTER — Ambulatory Visit: Payer: Medicaid Other | Attending: Internal Medicine

## 2020-03-19 ENCOUNTER — Ambulatory Visit: Payer: Medicaid Other | Attending: Internal Medicine

## 2020-03-19 DIAGNOSIS — Z23 Encounter for immunization: Secondary | ICD-10-CM

## 2020-03-19 NOTE — Progress Notes (Signed)
   Covid-19 Vaccination Clinic  Name:  IZAIYAH KLEINMAN    MRN: [id number] DOB: [birth year]  03/19/2020  Mr. Lanuza was observed post Covid-19 immunization for 15 minutes without incident. He was provided with Vaccine Information Sheet and instruction to access the V-Safe system.   Mr. Halls was instructed to call 911 with any severe reactions post vaccine: Marland Kitchen Difficulty breathing  . Swelling of face and throat  . A fast heartbeat  . A bad rash all over body  . Dizziness and weakness   Immunizations Administered    Name Date Dose VIS Date Route   Pfizer COVID-19 Vaccine 03/19/2020  2:43 PM 0.3 mL 11/28/2019 Intramuscular   Manufacturer: [employer], [employer]   Lot: [id number]   NDC: [address]-8

## 2020-04-13 ENCOUNTER — Ambulatory Visit: Payer: Medicaid Other

## 2020-04-22 ENCOUNTER — Ambulatory Visit: Payer: Medicaid Other

## 2020-04-24 ENCOUNTER — Ambulatory Visit: Payer: Medicaid Other | Attending: Internal Medicine

## 2020-04-24 DIAGNOSIS — Z23 Encounter for immunization: Secondary | ICD-10-CM

## 2020-04-24 NOTE — Progress Notes (Signed)
   Covid-19 Vaccination Clinic  Name:  Glenn Weber    MRN: [id number] DOB: 1981-03-01  04/24/2020  [name] was observed post Covid-19 immunization for 15 minutes without incident. He was provided with Vaccine Information Sheet and instruction to access the V-Safe system.   [name] was instructed to call 911 with any severe reactions post vaccine: Marland Kitchen Difficulty breathing  . Swelling of face and throat  . A fast heartbeat  . A bad rash all over body  . Dizziness and weakness   Immunizations Administered    Name Date Dose VIS Date Route   Pfizer COVID-19 Vaccine 04/24/2020 12:05 PM 0.3 mL 02/11/2019 Intramuscular   Manufacturer: [employer], [employer]   Lot: [id number]   NDC: [address]-5

## 2020-07-13 IMAGING — DX DG CHEST 2V
2 series · 2 of 2 positions shown · non-contrast
Comparison: 11/24/2013

CLINICAL DATA: Cough and congestion for 9 days.

EXAM:
CHEST - 2 VIEW

[chest pa]
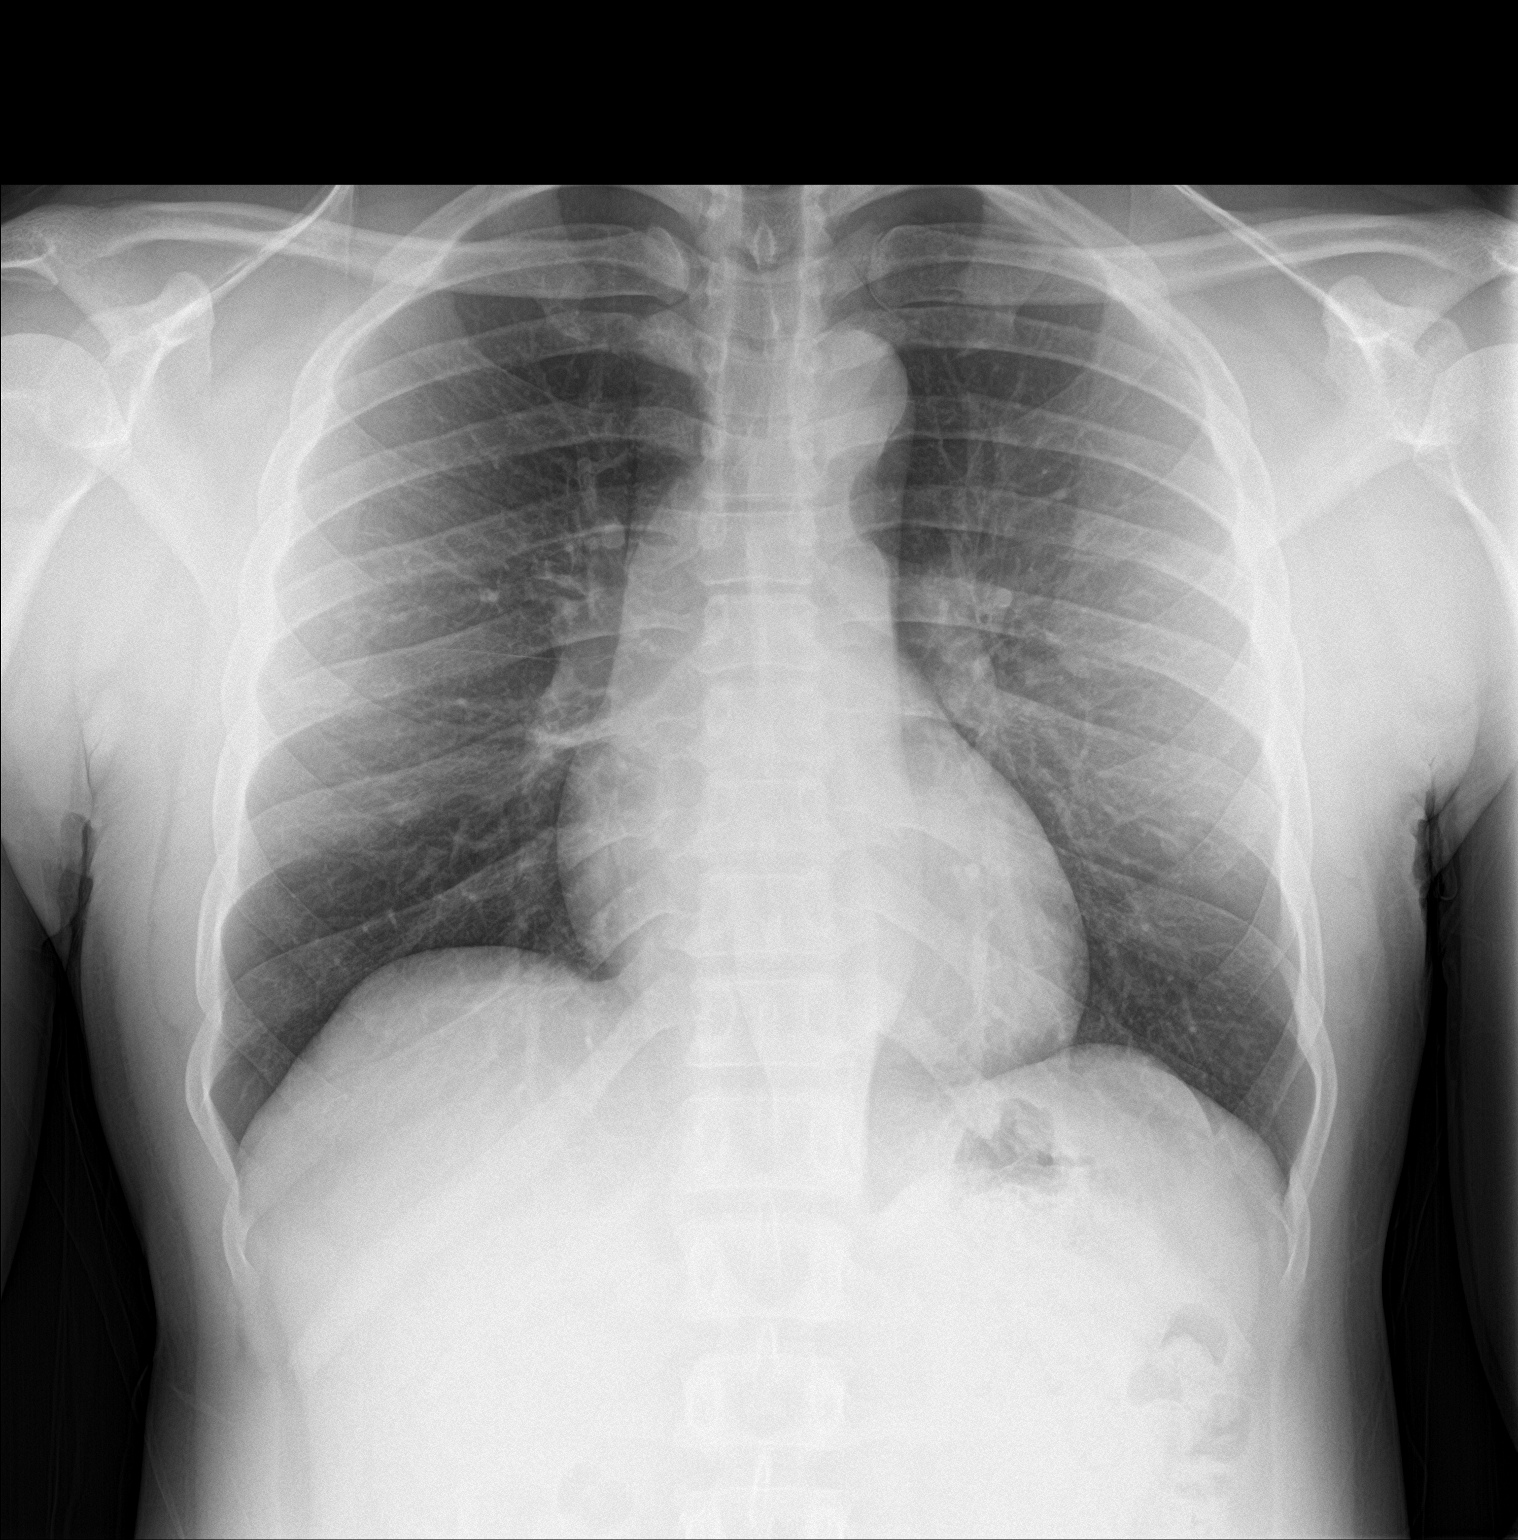

[chest lat]
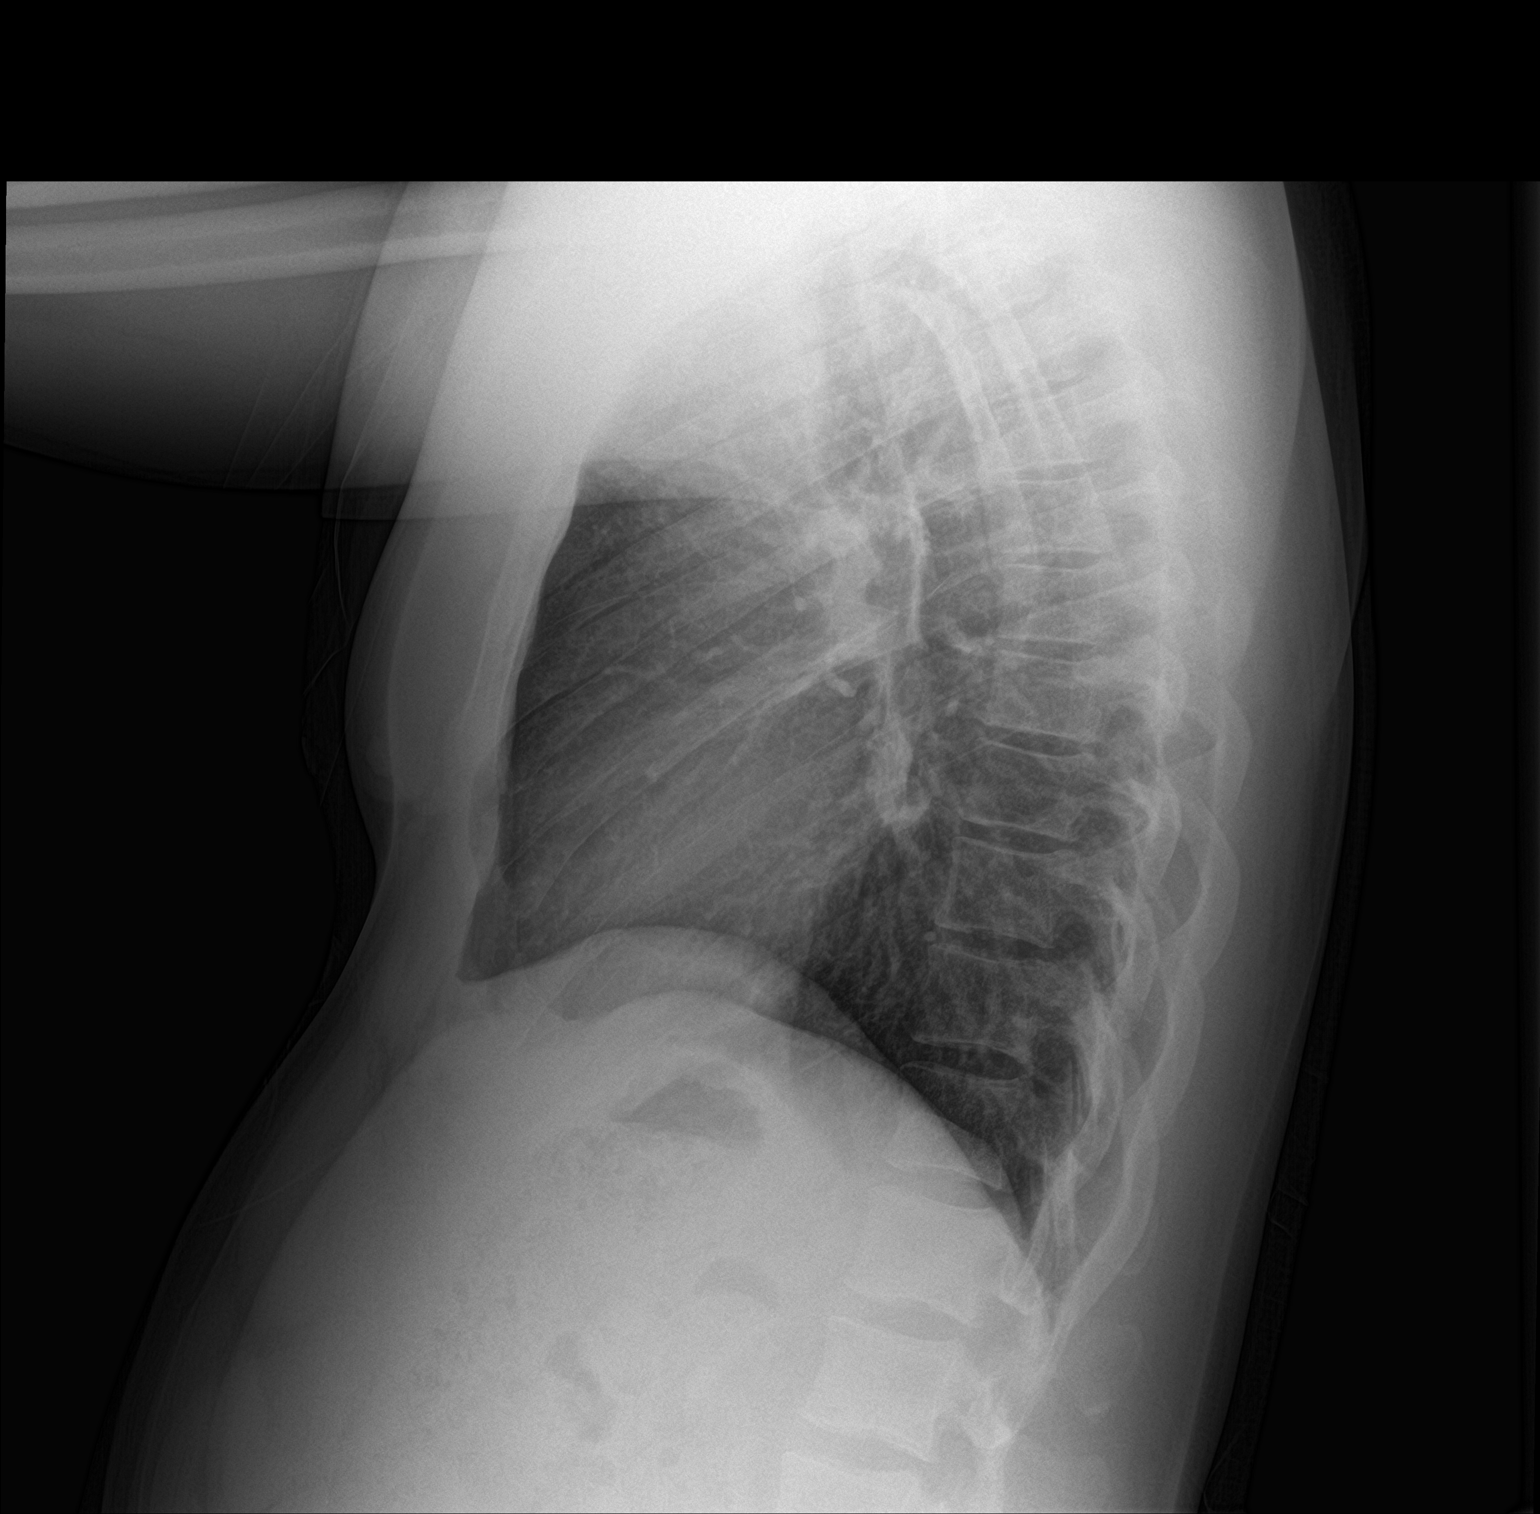

[2 of 2 positions shown; findings below may reference images not displayed]

FINDINGS: The heart size and mediastinal contours are within normal limits.
Both lungs are clear. Mild peribronchial thickening could be related
to smoking or mild bronchitis. The visualized skeletal structures
are unremarkable.
IMPRESSION: Mild peribronchial thickening likely related to smoking or possible
bronchitis. No pulmonary infiltrates

## 2021-07-25 ENCOUNTER — Emergency Department (HOSPITAL_BASED_OUTPATIENT_CLINIC_OR_DEPARTMENT_OTHER)
Admission: EM | Admit: 2021-07-25 | Discharge: 2021-07-25 | Disposition: A | Discharge status: Home / Self Care | Payer: Medicaid Other | Attending: Emergency Medicine | Admitting: Emergency Medicine

## 2021-07-25 ENCOUNTER — Other Ambulatory Visit: Payer: Self-pay

## 2021-07-25 ENCOUNTER — Encounter (HOSPITAL_BASED_OUTPATIENT_CLINIC_OR_DEPARTMENT_OTHER): Payer: Self-pay

## 2021-07-25 DIAGNOSIS — I10 Essential (primary) hypertension: Secondary | ICD-10-CM

## 2021-07-25 DIAGNOSIS — M542 Cervicalgia: Secondary | ICD-10-CM | POA: Insufficient documentation

## 2021-07-25 DIAGNOSIS — F17210 Nicotine dependence, cigarettes, uncomplicated: Secondary | ICD-10-CM | POA: Insufficient documentation

## 2021-07-25 DIAGNOSIS — Y92410 Unspecified street and highway as the place of occurrence of the external cause: Secondary | ICD-10-CM | POA: Diagnosis not present

## 2021-07-25 DIAGNOSIS — R519 Headache, unspecified: Secondary | ICD-10-CM | POA: Diagnosis present

## 2021-07-25 DIAGNOSIS — M546 Pain in thoracic spine: Secondary | ICD-10-CM | POA: Insufficient documentation

## 2021-07-25 DIAGNOSIS — M25511 Pain in right shoulder: Secondary | ICD-10-CM | POA: Diagnosis not present

## 2021-07-25 MED ORDER — METHOCARBAMOL 500 MG PO TABS
500.0000 mg | ORAL_TABLET | Freq: Once | ORAL | Status: AC
  Administered 2021-07-25: 500 mg via ORAL
  Filled 2021-07-25: qty 1

## 2021-07-25 MED ORDER — METHOCARBAMOL 1000 MG/10ML IJ SOLN
1000.0000 mg | Freq: Once | INTRAMUSCULAR | Status: DC
  Filled 2021-07-25: qty 10

## 2021-07-25 MED ORDER — LIDOCAINE 5 % EX PTCH
1.0000 | MEDICATED_PATCH | CUTANEOUS | Status: DC
  Administered 2021-07-25: 1 via TRANSDERMAL
  Filled 2021-07-25: qty 1

## 2021-07-25 MED ORDER — METHOCARBAMOL 500 MG PO TABS
500.0000 mg | ORAL_TABLET | Freq: Two times a day (BID) | ORAL | 0 refills | Status: DC
Start: 2021-07-25 — End: 2022-03-14

## 2021-07-25 MED ORDER — LIDOCAINE 4 % EX PTCH
1.0000 | MEDICATED_PATCH | Freq: Two times a day (BID) | CUTANEOUS | 0 refills | Status: DC | PRN
Start: 2021-07-25 — End: 2022-03-14

## 2021-07-25 MED ORDER — KETOROLAC TROMETHAMINE 60 MG/2ML IM SOLN
60.0000 mg | Freq: Once | INTRAMUSCULAR | Status: AC
  Administered 2021-07-25: 60 mg via INTRAMUSCULAR
  Filled 2021-07-25: qty 2

## 2021-07-25 NOTE — ED Notes (Signed)
Patient oriented to room and RN introduced self. Patient denies anything that would make him more comfortable at this time. Assisted with charging patients phone. Patient alert, oriented and in NAD at this time. Reports a hx of high BP.

## 2021-07-25 NOTE — ED Triage Notes (Signed)
States he has not been taking his blood pressure medication because he wanted to wean himself off.
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
## 2021-09-21 ENCOUNTER — Ambulatory Visit: Payer: Medicaid Other | Attending: Orthopedic Surgery | Admitting: Physical Therapy

## 2021-09-21 ENCOUNTER — Other Ambulatory Visit: Payer: Self-pay

## 2021-09-21 DIAGNOSIS — M5412 Radiculopathy, cervical region: Secondary | ICD-10-CM | POA: Diagnosis not present

## 2021-09-21 DIAGNOSIS — M6281 Muscle weakness (generalized): Secondary | ICD-10-CM | POA: Diagnosis present

## 2021-09-21 DIAGNOSIS — M62838 Other muscle spasm: Secondary | ICD-10-CM | POA: Diagnosis present

## 2021-09-21 NOTE — Patient Instructions (Signed)
Access Code: [email or web address] URL: [email or web address] Date: 09/21/2021 Prepared by: Oley Balm  Exercises Supine Cervical Rotation AROM on Pillow - 2 x daily - 7 x weekly - 1 sets - 5 reps Supine Chin Tuck - 2 x daily - 7 x weekly - 1 sets - 5 reps Seated Scapular Retraction - 2 x daily - 7 x weekly - 1 sets - 5 reps Supine Cervical Sidebending - 1 x daily - 7 x weekly - 3 sets - 10 reps
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
## 2021-09-28 ENCOUNTER — Ambulatory Visit: Payer: Medicaid Other | Admitting: Physical Therapy

## 2021-09-28 ENCOUNTER — Other Ambulatory Visit: Payer: Self-pay

## 2021-09-28 ENCOUNTER — Encounter: Payer: Self-pay | Admitting: Physical Therapy

## 2021-09-28 DIAGNOSIS — M5412 Radiculopathy, cervical region: Secondary | ICD-10-CM | POA: Diagnosis not present

## 2021-09-28 DIAGNOSIS — M6281 Muscle weakness (generalized): Secondary | ICD-10-CM

## 2021-09-28 DIAGNOSIS — M62838 Other muscle spasm: Secondary | ICD-10-CM

## 2021-09-28 NOTE — Therapy (Signed)
[facility] [place] [facility] [address] [place], [place], [address] Phone: [phone]   Fax:  [phone]  Physical Therapy Treatment  Patient Details  Name: Glenn Weber MRN: [id number] Date of Birth: Oct 17, 1981 No data recorded  Encounter Date: 09/28/2021   PT End of Session - 09/28/21 1559     Visit Number 2    Number of Visits 13    Date for PT Re-Evaluation 11/02/21    PT Start Time 1525    PT Stop Time 1600    PT Time Calculation (min) 35 min    Activity Tolerance Patient tolerated treatment well    Behavior During Therapy [facility] for tasks assessed/performed             Past Medical History:  Diagnosis Date   Hypertension     History reviewed. No pertinent surgical history.  There were no vitals filed for this visit.   Subjective Assessment - 09/28/21 1527     Subjective "After work it sucks" Aching  numbness and tingling down arms    Currently in Pain? Yes    Pain Score 2     Pain Location Neck                               [facility] Adult PT Treatment/Exercise - 09/28/21 0001       Exercises   Exercises Neck;Shoulder      Neck Exercises: Machines for Strengthening   Nustep L3 x 5 min    Cybex Row 25lb 2x10    Lat Pull 25lb 2x10      Neck Exercises: Standing   Other Standing Exercises Shoulder Ext 10lb 2x10; Horiz abd green 2x10    Other Standing Exercises Shoulder Green 2x10      Neck Exercises: Seated   Neck Retraction 3 secs;20 reps      Manual Therapy   Manual Therapy Soft tissue mobilization;Passive ROM    Soft tissue mobilization UT, rhomboids, Posterior cervical para spinales    Passive ROM cervical spine with end range holds                       PT Short Term Goals - 09/21/21 1901       PT SHORT TERM GOAL #1   Title Patient will perform HEP with written instructions, I    Baseline Given written program    Time 3    Period Weeks    Status New    Target  Date 10/12/21               PT Long Term Goals - 09/21/21 1902       PT LONG TERM GOAL #1   Title Patient will report decreased pain in neck and shoulders to max of 2/10    Baseline 10/10    Time 6    Target Date 11/02/21      PT LONG TERM GOAL #2   Title Patient will recover 90% ROM in all cervical planes, with no increased pain.    Baseline severely limited in all directions.    Time 3    Period Weeks    Status New    Target Date 11/02/21      PT LONG TERM GOAL #3   Title Patient will perform advanced HEP i.    Baseline Initated    Time 6    Target Date 11/02/21  PT LONG TERM GOAL #4   Title Patient will return to job x at least 6 hours with report of pain < 4/10.    Baseline 10/10    Time 6    Period Weeks    Target Date 11/02/21                   Plan - 09/28/21 1559     Clinical Impression Statement Pt`10 minutes late for today session. Reports that's his medication has been helping, but symptoms are worst after work. Added posterior chain strengthening to today session. Tactile cues needed to squeeze shoulder blades together with external rotation. Cue needed to hole contraction with rows. No report of increase pain with activity. Some tightness in UT reported with palpation.    Personal Factors and Comorbidities Past/Current Experience    Examination-Activity Limitations Bend;Lift;Sleep;Carry;Reach Overhead    Examination-Participation Restrictions Occupation;Interpersonal Relationship;Cleaning;Driving    Rehab Potential Good    PT Frequency 2x / week    PT Duration 6 weeks    PT Treatment/Interventions ADLs/Self Care Home Management;Cryotherapy;Electrical Stimulation;Iontophoresis 4mg /ml Dexamethasone;Moist Heat;Traction;Gait training;Functional mobility training;Therapeutic exercise;Balance training;Patient/family education;Manual techniques;Therapeutic activities;Neuromuscular re-education;Dry needling;Passive range of motion;Joint  Manipulations;Spinal Manipulations    PT Next Visit Plan Progress iwth ROM, distraction, strengthening, assess for additional pain modalities             Patient will benefit from skilled therapeutic intervention in order to improve the following deficits and impairments:  Decreased activity tolerance, Decreased strength, Impaired UE functional use, Postural dysfunction, Pain, Decreased range of motion, Decreased balance, Decreased endurance, Difficulty walking, Increased muscle spasms  Visit Diagnosis: Muscle weakness (generalized)  Other muscle spasm  Radiculopathy, cervical region     Problem List Patient Active Problem List   Diagnosis Date Noted   Cervical radiculopathy at C7 12/25/2012    02/22/2013, PTA 09/28/2021, 4:08 PM  [facility] [place] [facility] [address] [place], [place], [place] Phone: [phone]   Fax:  [phone]  Name: Glenn Weber MRN: [name] Date of Birth: April 01, 1981

## 2021-09-30 ENCOUNTER — Ambulatory Visit: Payer: Medicaid Other | Admitting: Physical Therapy

## 2021-10-03 ENCOUNTER — Ambulatory Visit: Payer: Medicaid Other | Admitting: Physical Therapy

## 2021-10-06 ENCOUNTER — Ambulatory Visit: Payer: Medicaid Other | Admitting: Physical Therapy

## 2021-10-10 ENCOUNTER — Ambulatory Visit: Payer: Medicaid Other | Admitting: Physical Therapy

## 2021-10-12 ENCOUNTER — Other Ambulatory Visit: Payer: Self-pay

## 2021-10-12 ENCOUNTER — Ambulatory Visit: Payer: Medicaid Other | Admitting: Physical Therapy

## 2021-10-12 DIAGNOSIS — M62838 Other muscle spasm: Secondary | ICD-10-CM

## 2021-10-12 DIAGNOSIS — M6281 Muscle weakness (generalized): Secondary | ICD-10-CM

## 2021-10-12 DIAGNOSIS — M5412 Radiculopathy, cervical region: Secondary | ICD-10-CM | POA: Diagnosis not present

## 2021-10-12 NOTE — Therapy (Signed)
[facility] [address] [place], [place], [address] Phone: [phone]   Fax:  [phone]  Physical Therapy Treatment  Patient Details  Name: Glenn Weber MRN: [id number] Date of Birth: Dec 01, 1981 No data recorded  Encounter Date: 10/12/2021   PT End of Session - 10/12/21 1656     Visit Number 3    Date for PT Re-Evaluation 11/02/21    PT Start Time 1611    PT Stop Time 1654    PT Time Calculation (min) 43 min    Activity Tolerance Patient tolerated treatment well    Behavior During Therapy [facility] for tasks assessed/performed             Past Medical History:  Diagnosis Date   Hypertension     No past surgical history on file.  There were no vitals filed for this visit.   Subjective Assessment - 10/12/21 1613     Subjective feeling all right, R shoulder has been bothering him.    Currently in Pain? No/denies                               [facility] Adult PT Treatment/Exercise - 10/12/21 0001       Neck Exercises: Machines for Strengthening   Nustep L3 x 5 min    Cybex Row 35lb 2x12    Lat Pull 35lb 2x12      Neck Exercises: Standing   Neck Retraction 20 reps;5 secs   red tband   Other Standing Exercises Shoulder Ext 10lb 2x10; Horiz abd green 2x10      Shoulder Exercises: Standing   External Rotation Both;20 reps;Weights    External Rotation Weight (lbs) 5    Flexion Strengthening;Both;20 reps;Weights    Shoulder Flexion Weight (lbs) 4    ABduction Strengthening;Both;Weights;20 reps    Shoulder ABduction Weight (lbs) 4                       PT Short Term Goals - 09/21/21 1901       PT SHORT TERM GOAL #1   Title Patient will perform HEP with written instructions, I    Baseline Given written program    Time 3    Period Weeks    Status New    Target Date 10/12/21               PT Long Term Goals - 09/21/21 1902       PT LONG TERM GOAL #1   Title  Patient will report decreased pain in neck and shoulders to max of 2/10    Baseline 10/10    Time 6    Target Date 11/02/21      PT LONG TERM GOAL #2   Title Patient will recover 90% ROM in all cervical planes, with no increased pain.    Baseline severely limited in all directions.    Time 3    Period Weeks    Status New    Target Date 11/02/21      PT LONG TERM GOAL #3   Title Patient will perform advanced HEP i.    Baseline Initated    Time 6    Target Date 11/02/21      PT LONG TERM GOAL #4   Title Patient will return to job x at least 6 hours with report of pain < 4/10.    Baseline  10/10    Time 6    Period Weeks    Target Date 11/02/21                   Plan - 10/12/21 1657     Clinical Impression Statement Pt ~ 11 minutes late for today's session. He did well with a progression in strength level interventions. Pt had good strength and ROM with all interventions. Some UE fatigue and burning reported with flexion and abduction. Postural cue required for standing shoulder extensions. Lead PT assisted in treatment providing DN.    Personal Factors and Comorbidities Past/Current Experience    Examination-Activity Limitations Bend;Lift;Sleep;Carry;Reach Overhead    Examination-Participation Restrictions Occupation;Interpersonal Relationship;Cleaning;Driving    Stability/Clinical Decision Making Evolving/Moderate complexity    Rehab Potential Good    PT Frequency 2x / week    PT Duration 6 weeks    PT Treatment/Interventions ADLs/Self Care Home Management;Cryotherapy;Electrical Stimulation;Iontophoresis 4mg /ml Dexamethasone;Moist Heat;Traction;Gait training;Functional mobility training;Therapeutic exercise;Balance training;Patient/family education;Manual techniques;Therapeutic activities;Neuromuscular re-education;Dry needling;Passive range of motion;Joint Manipulations;Spinal Manipulations    PT Next Visit Plan assess DN, Progress iwth ROM, distraction, strengthening,  assess for additional pain modalities             Patient will benefit from skilled therapeutic intervention in order to improve the following deficits and impairments:  Decreased activity tolerance, Decreased strength, Impaired UE functional use, Postural dysfunction, Pain, Decreased range of motion, Decreased balance, Decreased endurance, Difficulty walking, Increased muscle spasms  Visit Diagnosis: Muscle weakness (generalized)  Radiculopathy, cervical region  Other muscle spasm     Problem List Patient Active Problem List   Diagnosis Date Noted   Cervical radiculopathy at C7 12/25/2012    02/22/2013, PTA 10/12/2021, 5:00 PM  [facility] [place] [facility] [address] [place], [place], [place] Phone: [phone]   Fax:  [phone]  Name: CABLE FEARN MRN: [name] Date of Birth: [birth year]

## 2021-10-13 ENCOUNTER — Ambulatory Visit: Payer: Medicaid Other | Admitting: Physical Therapy

## 2021-10-17 ENCOUNTER — Ambulatory Visit: Payer: Medicaid Other | Admitting: Physical Therapy

## 2021-10-17 ENCOUNTER — Telehealth: Payer: Self-pay | Admitting: Physical Therapy

## 2021-10-17 NOTE — Telephone Encounter (Signed)
No-show for today's appointment. Called and spoke to Mr. Luhn, he apologizes as he forgot this appt was this early. Plans to be here for 11am appt on Wednesday.  Madelaine Etienne, DPT, PN2   Supplemental Physical Therapist [facility]    Pager [phone] Acute Rehab Office [phone]

## 2021-10-19 ENCOUNTER — Ambulatory Visit: Payer: Medicaid Other | Attending: Orthopedic Surgery | Admitting: Physical Therapy

## 2021-10-24 ENCOUNTER — Ambulatory Visit: Payer: Medicaid Other | Admitting: Physical Therapy

## 2021-10-26 ENCOUNTER — Ambulatory Visit: Payer: Medicaid Other | Admitting: Physical Therapy

## 2021-11-02 ENCOUNTER — Ambulatory Visit: Payer: Medicaid Other | Admitting: Physical Therapy

## 2021-11-03 ENCOUNTER — Ambulatory Visit: Payer: Medicaid Other | Admitting: Physical Therapy

## 2021-12-21 ENCOUNTER — Emergency Department (HOSPITAL_BASED_OUTPATIENT_CLINIC_OR_DEPARTMENT_OTHER): Payer: Medicaid Other

## 2021-12-21 ENCOUNTER — Emergency Department (HOSPITAL_BASED_OUTPATIENT_CLINIC_OR_DEPARTMENT_OTHER)
Admission: EM | Admit: 2021-12-21 | Discharge: 2021-12-21 | Disposition: A | Discharge status: Home / Self Care | Payer: Medicaid Other | Attending: Emergency Medicine | Admitting: Emergency Medicine

## 2021-12-21 ENCOUNTER — Encounter (HOSPITAL_BASED_OUTPATIENT_CLINIC_OR_DEPARTMENT_OTHER): Payer: Self-pay | Admitting: *Deleted

## 2021-12-21 ENCOUNTER — Other Ambulatory Visit: Payer: Self-pay

## 2021-12-21 DIAGNOSIS — I10 Essential (primary) hypertension: Secondary | ICD-10-CM | POA: Diagnosis not present

## 2021-12-21 DIAGNOSIS — Z79899 Other long term (current) drug therapy: Secondary | ICD-10-CM | POA: Insufficient documentation

## 2021-12-21 DIAGNOSIS — R109 Unspecified abdominal pain: Secondary | ICD-10-CM | POA: Insufficient documentation

## 2021-12-21 LAB — COMPREHENSIVE METABOLIC PANEL
ALT: 21 U/L (ref 0–44)
AST: 42 U/L — ABNORMAL HIGH (ref 15–41)
Albumin: 4.1 g/dL (ref 3.5–5.0)
Alkaline Phosphatase: 74 U/L (ref 38–126)
Anion gap: 9 (ref 5–15)
BUN: 17 mg/dL (ref 6–20)
CO2: 24 mmol/L (ref 22–32)
Calcium: 9 mg/dL (ref 8.9–10.3)
Chloride: 103 mmol/L (ref 98–111)
Creatinine, Ser: 1.3 mg/dL — ABNORMAL HIGH (ref 0.61–1.24)
GFR, Estimated: >60 mL/min (ref >60)
Glucose, Bld: 100 mg/dL — ABNORMAL HIGH (ref 70–99)
Potassium: 4.3 mmol/L (ref 3.5–5.1)
Sodium: 136 mmol/L (ref 135–145)
Total Bilirubin: 0.4 mg/dL (ref 0.3–1.2)
Total Protein: 7.4 g/dL (ref 6.5–8.1)

## 2021-12-21 LAB — CBC WITH DIFFERENTIAL/PLATELET
Abs Immature Granulocytes: 0.01 10*3/uL (ref 0.00–0.07)
Basophils Absolute: 0 10*3/uL (ref 0.0–0.1)
Basophils Relative: 1 %
Eosinophils Absolute: 0.3 10*3/uL (ref 0.0–0.5)
Eosinophils Relative: 5 %
HCT: 45 % (ref 39.0–52.0)
Hemoglobin: 15.4 g/dL (ref 13.0–17.0)
Immature Granulocytes: 0 %
Lymphocytes Relative: 42 %
Lymphs Abs: 2.6 10*3/uL (ref 0.7–4.0)
MCH: 32 pg (ref 26.0–34.0)
MCHC: 34.2 g/dL (ref 30.0–36.0)
MCV: 93.6 fL (ref 80.0–100.0)
Monocytes Absolute: 0.5 10*3/uL (ref 0.1–1.0)
Monocytes Relative: 9 %
Neutro Abs: 2.7 10*3/uL (ref 1.7–7.7)
Neutrophils Relative %: 43 %
Platelets: 259 10*3/uL (ref 150–400)
RBC: 4.81 MIL/uL (ref 4.22–5.81)
RDW: 14.1 % (ref 11.5–15.5)
WBC: 6.2 10*3/uL (ref 4.0–10.5)
nRBC: 0 % (ref 0.0–0.2)

## 2021-12-21 LAB — URINALYSIS, ROUTINE W REFLEX MICROSCOPIC
Bilirubin Urine: NEGATIVE
Glucose, UA: NEGATIVE mg/dL
Ketones, ur: NEGATIVE mg/dL
Leukocytes,Ua: NEGATIVE
Nitrite: NEGATIVE
Protein, ur: 30 mg/dL — AB
Specific Gravity, Urine: 1.025 (ref 1.005–1.030)
pH: 5.5 (ref 5.0–8.0)

## 2021-12-21 LAB — URINALYSIS, MICROSCOPIC (REFLEX)

## 2021-12-21 MED ORDER — CYCLOBENZAPRINE HCL 10 MG PO TABS
10.0000 mg | ORAL_TABLET | Freq: Once | ORAL | Status: AC
  Administered 2021-12-21: 10 mg via ORAL

## 2021-12-21 MED ORDER — KETOROLAC TROMETHAMINE 15 MG/ML IJ SOLN
15.0000 mg | Freq: Once | INTRAMUSCULAR | Status: AC
Start: 2021-12-21 — End: 2021-12-21
  Administered 2021-12-21: 15 mg via INTRAVENOUS

## 2021-12-21 MED ORDER — CYCLOBENZAPRINE HCL 10 MG PO TABS: 10.0000 mg | ORAL_TABLET | Freq: Two times a day (BID) | ORAL | 0 refills | Status: DC | PRN

## 2021-12-21 NOTE — ED Provider Notes (Signed)
[facility] EMERGENCY DEPARTMENT Provider Note   CSN: [id number] Arrival date & time: 12/21/21  0046     History  Chief Complaint  Patient presents with   Flank Pain    Glenn Weber is a 41 y.o. male.  The history is provided by the patient.  Flank Pain Glenn Weber is a 41 y.o. male who presents to the Emergency Department complaining of flank pain. He presents to the ED for evaluation  Pain is worse with eating, sitting/standing.  Pain is like a pressure sensation.  Pain has been present for about a week.  Initially pain started at work and was sharp, now it is a dull throb/ache.    No fever, N/V, dysuria.  Eating and drinking well.    Has a hx/o HTN, takes metoprolol.  No hx/o kidney stones.  Works for [employer].       Home Medications Prior to Admission medications   Medication Sig Start Date End Date Taking? Authorizing Provider  cyclobenzaprine (FLEXERIL) 10 MG tablet Take 1 tablet (10 mg total) by mouth 2 (two) times daily as needed for muscle spasms. 12/21/21  Yes Tilden Fossa, MD  benzonatate (TESSALON) 100 MG capsule Take 1 capsule (100 mg total) by mouth every 8 (eight) hours. 11/23/18   Petrucelli, Samantha R, PA-C  fluticasone (FLONASE) 50 MCG/ACT nasal spray Place 1 spray into both nostrils daily. 11/23/18   Petrucelli, Samantha R, PA-C  gabapentin (NEURONTIN) 100 MG capsule Take 1 capsule (100 mg total) by mouth 3 (three) times daily. 12/05/12   Derwood Kaplan, MD  Lidocaine (HM LIDOCAINE PATCH) 4 % PTCH Apply 1 patch topically every 12 (twelve) hours as needed. 07/25/21   Haskel Schroeder, PA-C  methocarbamol (ROBAXIN) 500 MG tablet Take 1 tablet (500 mg total) by mouth 2 (two) times daily. 07/25/21   Haskel Schroeder, PA-C  naproxen (NAPROSYN) 500 MG tablet Take 1 tablet (500 mg total) by mouth 2 (two) times daily. 11/23/18   Petrucelli, Pleas Koch, PA-C      Allergies    Patient has no known allergies.    Review of Systems   Review of Systems   Genitourinary:  Positive for flank pain.  All other systems reviewed and are negative.  Physical Exam Updated Vital Signs BP (!) 159/106    Pulse 62    Temp 98.2 F (36.8 C) (Oral)    Resp 16    Ht 5\' 6"  (1.676 m)    Wt 86.2 kg    SpO2 99%    BMI 30.67 kg/m  Physical Exam Vitals and nursing note reviewed.  Constitutional:      Appearance: He is well-developed.  HENT:     Head: Normocephalic and atraumatic.  Cardiovascular:     Rate and Rhythm: Normal rate and regular rhythm.     Heart sounds: No murmur heard. Pulmonary:     Effort: Pulmonary effort is normal. No respiratory distress.     Breath sounds: Normal breath sounds.  Abdominal:     Palpations: Abdomen is soft.     Tenderness: There is no right CVA tenderness, left CVA tenderness, guarding or rebound.     Comments: Mild right sided abdominal tenderness  Musculoskeletal:        General: No tenderness.     Comments: 2+ DP pulses bilaterally  Skin:    General: Skin is warm and dry.  Neurological:     Mental Status: He is alert and oriented to person, place, and  time.  Psychiatric:        Behavior: Behavior normal.    ED Results / Procedures / Treatments   Labs (all labs ordered are listed, but only abnormal results are displayed) Labs Reviewed  URINALYSIS, ROUTINE W REFLEX MICROSCOPIC - Abnormal; Notable for the following components:      Result Value   Hgb urine dipstick TRACE (*)    Protein, ur 30 (*)    All other components within normal limits  URINALYSIS, MICROSCOPIC (REFLEX) - Abnormal; Notable for the following components:   Bacteria, UA RARE (*)    All other components within normal limits  COMPREHENSIVE METABOLIC PANEL - Abnormal; Notable for the following components:   Glucose, Bld 100 (*)    Creatinine, Ser 1.30 (*)    AST 42 (*)    All other components within normal limits  CBC WITH DIFFERENTIAL/PLATELET    EKG None  Radiology CT Renal Stone Study  Result Date: 12/21/2021 CLINICAL DATA:   Right flank pain. EXAM: CT ABDOMEN AND PELVIS WITHOUT CONTRAST TECHNIQUE: Multidetector CT imaging of the abdomen and pelvis was performed following the standard protocol without IV contrast. COMPARISON:  None. FINDINGS: Lower chest: No acute abnormality. Hepatobiliary: No focal liver abnormality is seen. The gallbladder is contracted without evidence of gallstones, gallbladder wall thickening, or biliary dilatation. Pancreas: Unremarkable. No pancreatic ductal dilatation or surrounding inflammatory changes. Spleen: Normal in size without focal abnormality. Adrenals/Urinary Tract: Adrenal glands are unremarkable. Kidneys are normal, without renal calculi, focal lesion, or hydronephrosis. Bladder is unremarkable. Stomach/Bowel: Stomach is within normal limits. Appendix appears normal. No evidence of bowel wall thickening, distention, or inflammatory changes. Vascular/Lymphatic: No significant vascular findings are present. No enlarged abdominal or pelvic lymph nodes. Reproductive: Prostate is unremarkable. Other: No abdominal wall hernia or abnormality. No abdominopelvic ascites. Musculoskeletal: No acute or significant osseous findings. IMPRESSION: No evidence of acute or active process within the abdomen or pelvis. Electronically Signed   By: Aram Candela M.D.   On: 12/21/2021 03:47    Procedures Procedures    Medications Ordered in ED Medications  ketorolac (TORADOL) 15 MG/ML injection 15 mg (15 mg Intravenous Given 12/21/21 0426)  cyclobenzaprine (FLEXERIL) tablet 10 mg (10 mg Oral Given 12/21/21 0425)    ED Course/ Medical Decision Making/ A&P                           Medical Decision Making  Patient here for evaluation of 1 week of progressive flank pain.  Pulses are symmetric.  Pain does change with positions as well as meals but is not reproducible on palpation.  UA is not consistent with UTI.  Given distribution of pain a CT stone study was obtained, which is negative for obstructing  stone.  BMP with borderline elevation in creatinine, suspect this is baseline and normal for patient given his age and muscular status.  CBC is without acute abnormality.  Discussed with patient likely musculoskeletal pain.  Current presentation is not consistent with cholecystitis, appendicitis, dissection, PE, rhabdomyolysis.  Discussed with patient home care with NSAIDs, rest and activity as tolerated.  Return precautions discussed.        Final Clinical Impression(s) / ED Diagnoses Final diagnoses:  Right flank pain    Rx / DC Orders ED Discharge Orders          Ordered    cyclobenzaprine (FLEXERIL) 10 MG tablet  2 times daily PRN  12/21/21 0410              Tilden Fossaees, Allura Doepke, MD 12/21/21 [phone]

## 2021-12-21 NOTE — ED Notes (Signed)
Patient discharged to home.  All discharge instructions reviewed.  Patient verbalized understanding via teachback method.  VS WDL.  Respirations even and unlabored.  Ambulatory out of ED.   °

## 2021-12-21 NOTE — ED Triage Notes (Signed)
C/o flank pain x 1 week

## 2022-01-05 ENCOUNTER — Ambulatory Visit: Payer: Medicaid Other | Attending: Orthopedic Surgery | Admitting: Physical Therapy

## 2022-01-05 ENCOUNTER — Other Ambulatory Visit: Payer: Self-pay

## 2022-01-05 ENCOUNTER — Encounter: Payer: Self-pay | Admitting: Physical Therapy

## 2022-01-05 DIAGNOSIS — M5412 Radiculopathy, cervical region: Secondary | ICD-10-CM | POA: Diagnosis present

## 2022-01-05 DIAGNOSIS — M5414 Radiculopathy, thoracic region: Secondary | ICD-10-CM | POA: Insufficient documentation

## 2022-01-05 DIAGNOSIS — M62838 Other muscle spasm: Secondary | ICD-10-CM | POA: Insufficient documentation

## 2022-01-05 NOTE — Therapy (Signed)
[facility] [place] [address] [place], [place], [address] Phone: [phone]   Fax:  [phone]  Physical Therapy Evaluation  Patient Details  Name: Glenn Weber MRN: [id number] Date of Birth: 1981/04/24 Referring Provider (PT): Haskel Schroeder   Encounter Date: 01/05/2022   PT End of Session - 01/05/22 1447     Visit Number 1    Number of Visits 16    Date for PT Re-Evaluation 03/02/22    PT Start Time 1413    PT Stop Time 1446    PT Time Calculation (min) 33 min    Activity Tolerance Patient tolerated treatment well    Behavior During Therapy [facility] for tasks assessed/performed             Past Medical History:  Diagnosis Date   Hypertension     History reviewed. No pertinent surgical history.  There were no vitals filed for this visit.    Subjective Assessment - 01/05/22 1416     Subjective Rshoulder is feeling better, but still painful. His R mid back hurts when he eats or when he breathes in deep.    Pertinent History HTN, H/O L neck and shoulder injury several years ago with radiating pain.    How long can you sit comfortably? 5 minutes, also very painful when lying down.    How long can you stand comfortably? N/A    Diagnostic tests kidney stone ruled out.    Patient Stated Goals Patient wants to reduce pain to return to PLOF.    Currently in Pain? 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    Pain Relieving Factors Pain meds give limited relief    Effect of Pain on Daily Activities Unloads trunks at [employer], limited                [facility] PT Assessment - 01/05/22 0001       Assessment   Medical Diagnosis R neck/shoulder pain, R flank pain    Referring  Provider (PT) Haskel Schroeder    Onset Date/Surgical Date 07/23/21      Balance Screen   Has the patient fallen in the past 6 months No      Home Environment   Living Environment Private residence    Living Arrangements Spouse/significant other    Available Help at Discharge Family    Type of Home Apartment    Home Access Stairs to enter    Additional Comments 3rd floor      Prior Function   Level of Independence Independent    Vocation Full time employment    Vocation Requirements Unloads trucks for [employer]    Leisure fish play drums, guitar      Cognition   Overall Cognitive Status Within Functional Limits for tasks assessed      Observation/Other Assessments   Observations In prone, R lower thoracic spine appear possibly rotated to R with paraspinals slightly higher than L      Posture/Postural Control   Posture/Postural Control No significant limitations      ROM / Strength   AROM / PROM / Strength AROM;Strength  AROM   Overall AROM Comments thoraco-lumbar ROM-100% flexion and ext, R lateral flex and rotation 40% with pain, L 100%.    AROM Assessment Site Cervical    Cervical Flexion 100%    Cervical Extension 80%    Cervical - Right Side Bend 40%    Cervical - Left Side Bend 50%    Cervical - Right Rotation 80%    Cervical - Left Rotation 80%      Strength   Overall Strength Within functional limits for tasks performed      Palpation   Palpation comment Patient slightly TTP over R up traps, R thoracic paraspinals                        Objective measurements completed on examination: See above findings.                PT Education - 01/05/22 1445     Education Details Try to perform his previous HEP to assess tolerance, limited assessment today, will further assess next visit. Initiated ed for correct lifting techniques for his job. Keep object close, lift iwth legs, step with feet to pivot and don't rotate in trunk.    Person(s)  Educated Patient    Methods Explanation;Demonstration    Comprehension Verbalized understanding              PT Short Term Goals - 01/05/22 1541       PT SHORT TERM GOAL #1   Title I with inital HEP.    Time 4    Period Weeks    Status New    Target Date 02/02/22               PT Long Term Goals - 01/05/22 1541       PT LONG TERM GOAL #1   Title I with final HEP    Time 8    Period Weeks    Status New    Target Date 03/02/22      PT LONG TERM GOAL #2   Title Patient will recover 90% ROM in all thoraco-lumbar mobility, in all planes, with no increased pain.    Baseline < 50% normal in R lateral flex and rotation.    Time 8    Period Weeks    Status New    Target Date 03/02/22      PT LONG TERM GOAL #3   Title Patient will verbalize and demosntrate appropriate lifting technqiues to minimize strain on back.    Baseline Reports rotation with load.    Time 8    Period Weeks    Status New    Target Date 03/02/22      PT LONG TERM GOAL #4   Title Patient will return to job x at least 6 hours with report of pain < 4/10.    Baseline 8/10    Time 8    Period Weeks    Status New    Target Date 03/02/22                    Plan - 01/05/22 1448     Clinical Impression Statement Patient arrived late for evaluation, so limited assessment performed. His cervical ROM is improved and his R shoulder/cervical pain are imporved, but he reports severe R flank pain. Increased pain when he is full or takes a deep breath. R lower thoracic paraspinals appear elevated compared to L. Need to further  assess cause. 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 He reports a lot of rotation with the packages. Initiated education for appropriate lifting techniques to decrease strain on back. Needs more education and practice.    Personal Factors and Comorbidities Past/Current Experience    Examination-Activity Limitations Bend;Lift;Sleep;Carry;Reach  Overhead    Examination-Participation Restrictions Occupation;Interpersonal Relationship;Cleaning;Driving    Stability/Clinical Decision Making Evolving/Moderate complexity    Clinical Decision Making Moderate    Rehab Potential Good    PT Frequency 2x / week    PT Duration 8 weeks    PT Treatment/Interventions ADLs/Self Care Home Management;Cryotherapy;Electrical Stimulation;Iontophoresis 4mg /ml Dexamethasone;Moist Heat;Traction;Gait training;Functional mobility training;Therapeutic exercise;Balance training;Patient/family education;Manual techniques;Therapeutic activities;Neuromuscular re-education;Dry needling;Passive range of motion;Joint Manipulations;Spinal Manipulations    PT Next Visit Plan Further assessment warrented for thoracic spine and back pain.    PT Home Exercise Plan Patient told to attempt to perform his previous HEP and see how it feels.    Consulted and Agree with Plan of Care Patient             Patient will benefit from skilled therapeutic intervention in order to improve the following deficits and impairments:  Decreased activity tolerance, Decreased strength, Impaired UE functional use, Postural dysfunction, Pain, Decreased range of motion, Decreased balance, Decreased endurance, Difficulty walking, Increased muscle spasms  Visit Diagnosis: Other muscle spasm  Radiculopathy, cervical region  Radiculopathy, thoracic region     Problem List Patient Active Problem List   Diagnosis Date Noted   Cervical radiculopathy at C7 12/25/2012    02/22/2013, DPT 01/05/2022, 3:46 PM  [facility] [address] [place], [place], [place] Phone: [phone]   Fax:  [phone]  Name: Glenn Weber MRN: [name] Date of Birth: 12/20/1980

## 2022-01-09 ENCOUNTER — Ambulatory Visit: Payer: Medicaid Other | Admitting: Physical Therapy

## 2022-01-18 ENCOUNTER — Other Ambulatory Visit: Payer: Self-pay

## 2022-01-18 ENCOUNTER — Ambulatory Visit: Payer: Medicaid Other | Attending: Orthopedic Surgery | Admitting: Physical Therapy

## 2022-01-18 DIAGNOSIS — M5412 Radiculopathy, cervical region: Secondary | ICD-10-CM | POA: Diagnosis present

## 2022-01-18 DIAGNOSIS — M5414 Radiculopathy, thoracic region: Secondary | ICD-10-CM | POA: Insufficient documentation

## 2022-01-18 DIAGNOSIS — M62838 Other muscle spasm: Secondary | ICD-10-CM | POA: Insufficient documentation

## 2022-01-18 DIAGNOSIS — M6281 Muscle weakness (generalized): Secondary | ICD-10-CM | POA: Diagnosis present

## 2022-01-18 NOTE — Patient Instructions (Signed)
Access Code: [email or web address] URL: [email or web address] Date: 01/18/2022 Prepared by: Oley Balm  Exercises Seated Cervical Retraction Extension Passive Loaded - 1 x daily - 7 x weekly - 10 reps - 10 hold Seated Cervical Sidebending Stretch - 1 x daily - 7 x weekly - 10 reps - 10 hold Seated Gentle Upper Trapezius Stretch - 1 x daily - 7 x weekly - 3 sets - 20 hold Seated Thoracic Flexion and Rotation with Arms Crossed - 1 x daily - 7 x weekly - 5 reps

## 2022-01-18 NOTE — Therapy (Signed)
[facility] [address] [place], [place], [address] Phone: [phone]   Fax:  [phone]  Physical Therapy Treatment  Patient Details  Name: Glenn Weber MRN: [id number] Date of Birth: September 26, 1981 Referring Provider (PT): Haskel Schroeder   Encounter Date: 01/18/2022   PT End of Session - 01/18/22 1548     Visit Number 2    Number of Visits 16    Date for PT Re-Evaluation 03/02/22    PT Start Time 1510    PT Stop Time 1548    PT Time Calculation (min) 38 min    Activity Tolerance Patient tolerated treatment well    Behavior During Therapy [facility] for tasks assessed/performed             Past Medical History:  Diagnosis Date   Hypertension     No past surgical history on file.  There were no vitals filed for this visit.   Subjective Assessment - 01/18/22 1511     Subjective Patient reports R shoulder is tightening up again. His flank pain is remaining, but milder. He performed his HEP, whihc may need to be increased.    Pertinent History HTN, H/O L neck and shoulder injury several years ago with radiating pain.    Currently in Pain? Yes    Pain Score 3     Pain Location Flank    Pain Orientation Right    Pain Descriptors / Indicators Dull    Pain Type Chronic pain    Pain Onset More than a month ago    Pain Frequency Intermittent                               [facility] Adult PT Treatment/Exercise - 01/18/22 0001       Neck Exercises: Stretches   Upper Trapezius Stretch 5 reps;20 seconds;Right    Neck Stretch 10 seconds   cervical side bening iwth over pressure.   Other Neck Stretches chin tucks with over pressure and self distraction to posterior neck x 5, 5 sec holds.      Manual Therapy   Manual Therapy Soft tissue mobilization;Passive ROM    Soft tissue mobilization UT, rhomboids, Posterior cervical para spinales              Trigger Point Dry Needling - 01/18/22 0001      Consent Given? Yes    Education Handout Provided Yes    Muscles Treated Head and Neck Upper trapezius    Upper Trapezius Response Twitch reponse elicited;Palpable increased muscle length                   PT Education - 01/18/22 1547     Education Details UPdated HEP, importance of consistent performance to mobilize neck and stretch upper traps.    Person(s) Educated Patient    Methods Explanation;Handout;Demonstration    Comprehension Verbalized understanding;Returned demonstration              PT Short Term Goals - 01/18/22 1725       PT SHORT TERM GOAL #1   Title I with inital HEP.    Baseline HEP updated today.    Time 2    Period Weeks    Status On-going    Target Date 02/02/22               PT Long Term Goals - 01/05/22 1541  PT LONG TERM GOAL #1   Title I with final HEP    Time 8    Period Weeks    Status New    Target Date 03/02/22      PT LONG TERM GOAL #2   Title Patient will recover 90% ROM in all thoraco-lumbar mobility, in all planes, with no increased pain.    Baseline < 50% normal in R lateral flex and rotation.    Time 8    Period Weeks    Status New    Target Date 03/02/22      PT LONG TERM GOAL #3   Title Patient will verbalize and demosntrate appropriate lifting technqiues to minimize strain on back.    Baseline Reports rotation with load.    Time 8    Period Weeks    Status New    Target Date 03/02/22      PT LONG TERM GOAL #4   Title Patient will return to job x at least 6 hours with report of pain < 4/10.    Baseline 8/10    Time 8    Period Weeks    Status New    Target Date 03/02/22                   Plan - 01/18/22 1633     Clinical Impression Statement Patietn was 10 minutes late for treatment, encouraged him to try to arrive on time as it cuts his treatment short. He reports that his UT tightness and pain are returning. Therapsit facilitated multipel exercises for upper and lower cervical  moblizaiton followed by stretch and DN. He responded well to mobs, added to his HEP.    Personal Factors and Comorbidities Past/Current Experience    Examination-Activity Limitations Bend;Lift;Sleep;Carry;Reach Overhead    Examination-Participation Restrictions Occupation;Interpersonal Relationship;Cleaning;Driving    Stability/Clinical Decision Making Evolving/Moderate complexity    Rehab Potential Good    PT Frequency 2x / week    PT Duration 8 weeks    PT Treatment/Interventions ADLs/Self Care Home Management;Cryotherapy;Electrical Stimulation;Iontophoresis 4mg /ml Dexamethasone;Moist Heat;Traction;Gait training;Functional mobility training;Therapeutic exercise;Balance training;Patient/family education;Manual techniques;Therapeutic activities;Neuromuscular re-education;Dry needling;Passive range of motion;Joint Manipulations;Spinal Manipulations    PT Next Visit Plan Further assessment warrented for thoracic spine and back pain.    PT Home Exercise Plan Patient told to attempt to perform his previous HEP and see how it feels.    Consulted and Agree with Plan of Care Patient             Patient will benefit from skilled therapeutic intervention in order to improve the following deficits and impairments:  Decreased activity tolerance, Decreased strength, Impaired UE functional use, Postural dysfunction, Pain, Decreased range of motion, Decreased balance, Decreased endurance, Difficulty walking, Increased muscle spasms  Visit Diagnosis: Other muscle spasm  Radiculopathy, cervical region  Radiculopathy, thoracic region  Muscle weakness (generalized)     Problem List Patient Active Problem List   Diagnosis Date Noted   Cervical radiculopathy at C7 12/25/2012    02/22/2013, DPT 01/18/2022, 6:12 PM  [facility] [address] [place], [place], [place] Phone: [phone]   Fax:  [phone]  Name: Glenn Weber MRN:  [name] Date of Birth: 11-18-1981

## 2022-01-25 ENCOUNTER — Encounter: Payer: Self-pay | Admitting: Physical Therapy

## 2022-01-25 ENCOUNTER — Ambulatory Visit: Payer: Medicaid Other | Admitting: Physical Therapy

## 2022-01-25 ENCOUNTER — Other Ambulatory Visit: Payer: Self-pay

## 2022-01-25 DIAGNOSIS — M62838 Other muscle spasm: Secondary | ICD-10-CM | POA: Diagnosis not present

## 2022-01-25 DIAGNOSIS — M5412 Radiculopathy, cervical region: Secondary | ICD-10-CM

## 2022-01-25 DIAGNOSIS — M6281 Muscle weakness (generalized): Secondary | ICD-10-CM

## 2022-01-25 NOTE — Therapy (Signed)
[facility] [address] [place], [place], [address] Phone: [phone]   Fax:  [phone]  Physical Therapy Treatment  Patient Details  Name: Glenn Weber MRN: [id number] Date of Birth: 1981-04-12 Referring Provider (PT): Haskel Schroeder   Encounter Date: 01/25/2022   PT End of Session - 01/25/22 1551     Visit Number 3    Date for PT Re-Evaluation 03/02/22    PT Start Time 1530    PT Stop Time 1600    PT Time Calculation (min) 30 min    Activity Tolerance Patient tolerated treatment well    Behavior During Therapy [facility] for tasks assessed/performed             Past Medical History:  Diagnosis Date   Hypertension     History reviewed. No pertinent surgical history.  There were no vitals filed for this visit.   Subjective Assessment - 01/25/22 1530     Subjective All right, still has some stiffness in his neck.    Currently in Pain? No/denies                               [facility] Adult PT Treatment/Exercise - 01/25/22 0001       Neck Exercises: Machines for Strengthening   UBE (Upper Arm Bike) L2 x 2 min each    Cybex Row 35lb 2x12    Lat Pull 35lb 2x12      Neck Exercises: Standing   Other Standing Exercises Shoulder ER green 2x12    Other Standing Exercises Shrugs 8lb 2x10 with levator stretch      Neck Exercises: Stretches   Other Neck Stretches Chin chucks 2x10      Shoulder Exercises: Standing   Flexion Strengthening;Both;20 reps;Weights    Shoulder Flexion Weight (lbs) 5    ABduction Strengthening;Both;Weights;20 reps    Shoulder ABduction Weight (lbs) 4                       PT Short Term Goals - 01/18/22 1725       PT SHORT TERM GOAL #1   Title I with inital HEP.    Baseline HEP updated today.    Time 2    Period Weeks    Status On-going    Target Date 02/02/22               PT Long Term Goals - 01/05/22 1541       PT LONG TERM GOAL #1    Title I with final HEP    Time 8    Period Weeks    Status New    Target Date 03/02/22      PT LONG TERM GOAL #2   Title Patient will recover 90% ROM in all thoraco-lumbar mobility, in all planes, with no increased pain.    Baseline < 50% normal in R lateral flex and rotation.    Time 8    Period Weeks    Status New    Target Date 03/02/22      PT LONG TERM GOAL #3   Title Patient will verbalize and demosntrate appropriate lifting technqiues to minimize strain on back.    Baseline Reports rotation with load.    Time 8    Period Weeks    Status New    Target Date 03/02/22      PT LONG TERM GOAL #  4   Title Patient will return to job x at least 6 hours with report of pain < 4/10.    Baseline 8/10    Time 8    Period Weeks    Status New    Target Date 03/02/22                   Plan - 01/25/22 1553     Clinical Impression Statement Patient ~ 15 minutes late for today's session. He reports some overall improvement with decrease pain. No issue completing the interventions. Pt did report a pull with chin tucks and a stretch with levator stretch    Personal Factors and Comorbidities Past/Current Experience    Examination-Activity Limitations Bend;Lift;Sleep;Carry;Reach Overhead    Examination-Participation Restrictions Occupation;Interpersonal Relationship;Cleaning;Driving    Stability/Clinical Decision Making Evolving/Moderate complexity    Rehab Potential Good    PT Frequency 2x / week    PT Duration 8 weeks    PT Treatment/Interventions ADLs/Self Care Home Management;Cryotherapy;Electrical Stimulation;Iontophoresis 4mg /ml Dexamethasone;Moist Heat;Traction;Gait training;Functional mobility training;Therapeutic exercise;Balance training;Patient/family education;Manual techniques;Therapeutic activities;Neuromuscular re-education;Dry needling;Passive range of motion;Joint Manipulations;Spinal Manipulations    PT Next Visit Plan Further assessment warrented for thoracic  spine and back pain.             Patient will benefit from skilled therapeutic intervention in order to improve the following deficits and impairments:  Decreased activity tolerance, Decreased strength, Impaired UE functional use, Postural dysfunction, Pain, Decreased range of motion, Decreased balance, Decreased endurance, Difficulty walking, Increased muscle spasms  Visit Diagnosis: Other muscle spasm  Radiculopathy, cervical region  Muscle weakness (generalized)     Problem List Patient Active Problem List   Diagnosis Date Noted   Cervical radiculopathy at C7 12/25/2012    02/22/2013, PTA 01/25/2022, 3:57 PM  [facility] [address] [place], [place], [place] Phone: [phone]   Fax:  [phone]  Name: Glenn Weber MRN: [name] Date of Birth: 07/23/1981

## 2022-01-31 ENCOUNTER — Ambulatory Visit: Payer: Medicaid Other | Admitting: Physical Therapy

## 2022-02-06 ENCOUNTER — Other Ambulatory Visit: Payer: Self-pay

## 2022-02-06 ENCOUNTER — Encounter: Payer: Self-pay | Admitting: Physical Therapy

## 2022-02-06 ENCOUNTER — Ambulatory Visit: Payer: Medicaid Other | Admitting: Physical Therapy

## 2022-02-06 DIAGNOSIS — M6281 Muscle weakness (generalized): Secondary | ICD-10-CM

## 2022-02-06 DIAGNOSIS — M62838 Other muscle spasm: Secondary | ICD-10-CM | POA: Diagnosis not present

## 2022-02-06 DIAGNOSIS — M5412 Radiculopathy, cervical region: Secondary | ICD-10-CM

## 2022-02-06 DIAGNOSIS — M5414 Radiculopathy, thoracic region: Secondary | ICD-10-CM

## 2022-02-06 NOTE — Therapy (Signed)
[facility] [address] [place], [place], [address] Phone: [phone]   Fax:  [phone]  Physical Therapy Treatment  Patient Details  Name: Glenn Weber MRN: [id number] Date of Birth: [birth year] Referring Provider (PT): Haskel Schroeder   Encounter Date: 02/06/2022   PT End of Session - 02/06/22 1756     Visit Number 4    Date for PT Re-Evaluation 03/02/22    PT Start Time 1712    PT Stop Time 1756    PT Time Calculation (min) 44 min    Activity Tolerance Patient tolerated treatment well    Behavior During Therapy [facility] for tasks assessed/performed             Past Medical History:  Diagnosis Date   Hypertension     History reviewed. No pertinent surgical history.  There were no vitals filed for this visit.   Subjective Assessment - 02/06/22 1714     Subjective Still pretty sore in the right neck and upper trap area    Currently in Pain? Yes    Pain Score 5     Pain Location Neck    Pain Orientation Right    Pain Descriptors / Indicators Tightness;Spasm    Pain Relieving Factors the treatment is helping                               [facility] Adult PT Treatment/Exercise - 02/06/22 0001       Neck Exercises: Machines for Strengthening   UBE (Upper Arm Bike) L3 x 2 min each    Cybex Row 35lb 2x12    Lat Pull 35lb 2x12      Neck Exercises: Standing   Other Standing Exercises Shrugs 8lb 2x10 with upper trap and levator stretch      Shoulder Exercises: Standing   Horizontal ABduction 20 reps;Theraband    Theraband Level (Shoulder Horizontal ABduction) Level 2 (Red)    External Rotation Both;20 reps;Theraband    Theraband Level (Shoulder External Rotation) Level 2 (Red)      Manual Therapy   Manual Therapy Soft tissue mobilization;Passive ROM;Joint mobilization    Joint Mobilization thoracic P/A    Soft tissue mobilization UT, rhomboids, Posterior cervical para spinales    Passive ROM  cervical spine with end range holds, some contract relax,                       PT Short Term Goals - 01/18/22 1725       PT SHORT TERM GOAL #1   Title I with inital HEP.    Baseline HEP updated today.    Time 2    Period Weeks    Status On-going    Target Date 02/02/22               PT Long Term Goals - 02/06/22 1757       PT LONG TERM GOAL #1   Title I with final HEP    Status On-going      PT LONG TERM GOAL #2   Title Patient will recover 90% ROM in all thoraco-lumbar mobility, in all planes, with no increased pain.    Status On-going      PT LONG TERM GOAL #3   Title Patient will verbalize and demosntrate appropriate lifting technqiues to minimize strain on back.    Status On-going  PT LONG TERM GOAL #4   Title Patient will return to job x at least 6 hours with report of pain < 4/10.    Status On-going                   Plan - 02/06/22 1756     Clinical Impression Statement I really worked on the upper trap, levator and cervical mms, he has a significant knot in the right upper trap area, also c/o HA in the high right occipital area.  Needs cues at times to not elevate the shoulder    PT Next Visit Plan see how todays treatment did    Consulted and Agree with Plan of Care Patient             Patient will benefit from skilled therapeutic intervention in order to improve the following deficits and impairments:  Decreased activity tolerance, Decreased strength, Impaired UE functional use, Postural dysfunction, Pain, Decreased range of motion, Decreased balance, Decreased endurance, Difficulty walking, Increased muscle spasms  Visit Diagnosis: Other muscle spasm  Radiculopathy, cervical region  Muscle weakness (generalized)  Radiculopathy, thoracic region     Problem List Patient Active Problem List   Diagnosis Date Noted   Cervical radiculopathy at C7 12/25/2012    Jearld Lesch, PT 02/06/2022, 5:58 PM  [facility] [address] [place], [place], [address] Phone: [phone]   Fax:  [phone]  Name: SHAUGHN THOMLEY MRN: [id number] Date of Birth: [birth year]

## 2022-02-15 ENCOUNTER — Encounter: Payer: Self-pay | Admitting: Physical Therapy

## 2022-02-15 ENCOUNTER — Other Ambulatory Visit: Payer: Self-pay

## 2022-02-15 ENCOUNTER — Ambulatory Visit: Payer: Medicaid Other | Attending: Orthopedic Surgery | Admitting: Physical Therapy

## 2022-02-15 DIAGNOSIS — M6281 Muscle weakness (generalized): Secondary | ICD-10-CM

## 2022-02-15 DIAGNOSIS — M62838 Other muscle spasm: Secondary | ICD-10-CM | POA: Diagnosis not present

## 2022-02-15 DIAGNOSIS — M5412 Radiculopathy, cervical region: Secondary | ICD-10-CM | POA: Diagnosis present

## 2022-02-15 DIAGNOSIS — M5414 Radiculopathy, thoracic region: Secondary | ICD-10-CM | POA: Diagnosis present

## 2022-02-15 NOTE — Therapy (Signed)
[facility] ?[address] ?[place], [place], [address] ?Phone: [phone]   Fax:  [phone] ? ?Physical Therapy Treatment ? ?Patient Details  ?Name: Glenn Weber ?MRN: [id number] ?Date of Birth: 1981-05-22 ?Referring Provider (PT): Glenn Weber ? ? ?Encounter Date: 02/15/2022 ? ? PT End of Session - 02/15/22 1508   ? ? Visit Number 5   ? Date for PT Re-Evaluation 03/02/22   ? PT Start Time 1435   ? PT Stop Time 1515   ? PT Time Calculation (min) 40 min   ? Activity Tolerance Patient tolerated treatment well   ? Behavior During Therapy [facility] for tasks assessed/performed   ? ?  ?  ? ?  ? ? ?Past Medical History:  ?Diagnosis Date  ? Hypertension   ? ? ?History reviewed. No pertinent surgical history. ? ?There were no vitals filed for this visit. ? ? Subjective Assessment - 02/15/22 1435   ? ? Subjective Doing all right   ? Currently in Pain? No/denies   ? ?  ?  ? ?  ? ? ? ? ? ? ? ? ? ? ? ? ? ? ? ? ? ? ? ? [facility] Adult PT Treatment/Exercise - 02/15/22 0001   ? ?  ? Neck Exercises: Machines for Strengthening  ? UBE (Upper Arm Bike) L3 x 3 min each   ? Cybex Row 35lb 2x15   ? Cybex Chest Press 20lb 2x10   ? Lat Pull 35lb 2x15   ?  ? Neck Exercises: Standing  ? Other Standing Exercises Shoulder Ext 10lb 2x10; Horiz abd green 2x10   ? Other Standing Exercises Shrugs 8lb 2x10 with upper trap and levator stretch   ?  ? Neck Exercises: Stretches  ? Other Neck Stretches Chin chucks 2x10 yellow band   ?  ? Shoulder Exercises: Standing  ? Horizontal ABduction 20 reps;Theraband   ? Theraband Level (Shoulder Horizontal ABduction) Level 2 (Red)   ? External Rotation Both;20 reps;Theraband   ? Theraband Level (Shoulder External Rotation) Level 3 (Green)   ? Flexion Strengthening;Both;20 reps;Weights   ? Shoulder Flexion Weight (lbs) 5   ? ABduction Strengthening;Both;Weights;20 reps   ? Shoulder ABduction Weight (lbs) 4   ? ?  ?  ? ?  ? ? ? ? ? ? ? ? ? ? ? ? PT Short Term Goals -  01/18/22 1725   ? ?  ? PT SHORT TERM GOAL #1  ? Title I with inital HEP.   ? Baseline HEP updated today.   ? Time 2   ? Period Weeks   ? Status On-going   ? Target Date 02/02/22   ? ?  ?  ? ?  ? ? ? ? PT Long Term Goals - 02/06/22 1757   ? ?  ? PT LONG TERM GOAL #1  ? Title I with final HEP   ? Status On-going   ?  ? PT LONG TERM GOAL #2  ? Title Patient will recover 90% ROM in all thoraco-lumbar mobility, in all planes, with no increased pain.   ? Status On-going   ?  ? PT LONG TERM GOAL #3  ? Title Patient will verbalize and demosntrate appropriate lifting technqiues to minimize strain on back.   ? Status On-going   ?  ? PT LONG TERM GOAL #4  ? Title Patient will return to job x at least 6 hours with report of pain < 4/10.   ?  Status On-going   ? ?  ?  ? ?  ? ? ? ? ? ? ? ? Plan - 02/15/22 1508   ? ? Clinical Impression Statement Pt enters clinic feeling well with no pain. Pt continues to require cues at times not to elevate shoulders. Good ROM noted wit chin tucks. Cue to slow down reps needed with seated rows.  Muscle burning and fatigue reported towards the end of session.   ? Personal Factors and Comorbidities Past/Current Experience   ? Examination-Activity Limitations Bend;Lift;Sleep;Carry;Reach Overhead   ? Examination-Participation Restrictions Occupation;Interpersonal Relationship;Cleaning;Driving   ? Stability/Clinical Decision Making Evolving/Moderate complexity   ? Rehab Potential Good   ? PT Frequency 2x / week   ? PT Duration 8 weeks   ? PT Treatment/Interventions ADLs/Self Care Home Management;Cryotherapy;Electrical Stimulation;Iontophoresis 4mg /ml Dexamethasone;Moist Heat;Traction;Gait training;Functional mobility training;Therapeutic exercise;Balance training;Patient/family education;Manual techniques;Therapeutic activities;Neuromuscular re-education;Dry needling;Passive range of motion;Joint Manipulations;Spinal Manipulations   ? PT Next Visit Plan see how todays treatment did   ? ?  ?  ? ?   ? ? ?Patient will benefit from skilled therapeutic intervention in order to improve the following deficits and impairments:  Decreased activity tolerance, Decreased strength, Impaired UE functional use, Postural dysfunction, Pain, Decreased range of motion, Decreased balance, Decreased endurance, Difficulty walking, Increased muscle spasms ? ?Visit Diagnosis: ?Other muscle spasm ? ?Radiculopathy, cervical region ? ?Radiculopathy, thoracic region ? ?Muscle weakness (generalized) ? ? ? ? ?Problem List ?Patient Active Problem List  ? Diagnosis Date Noted  ? Cervical radiculopathy at C7 12/25/2012  ? ? ?02/22/2013, PTA ?02/15/2022, 3:11 PM ? ?[name] ?[address] ?[place], [place], [place] ?Phone: [phone]   Fax:  [phone] ? ?Name: Glenn Weber ?MRN: [name] ?Date of Birth: 1981/12/08 ? ? ? ?

## 2022-02-22 ENCOUNTER — Ambulatory Visit: Payer: Medicaid Other | Admitting: Physical Therapy

## 2022-02-23 ENCOUNTER — Encounter: Payer: Self-pay | Admitting: Physician Assistant

## 2022-03-06 ENCOUNTER — Ambulatory Visit: Payer: Medicaid Other | Admitting: Physical Therapy

## 2022-03-07 ENCOUNTER — Ambulatory Visit: Payer: Medicaid Other | Admitting: Physician Assistant

## 2022-03-13 ENCOUNTER — Encounter (HOSPITAL_BASED_OUTPATIENT_CLINIC_OR_DEPARTMENT_OTHER): Payer: Self-pay

## 2022-03-13 ENCOUNTER — Emergency Department (HOSPITAL_BASED_OUTPATIENT_CLINIC_OR_DEPARTMENT_OTHER)
Admission: EM | Admit: 2022-03-13 | Discharge: 2022-03-14 | Disposition: A | Discharge status: Home / Self Care | Payer: Medicaid Other | Attending: Emergency Medicine | Admitting: Emergency Medicine

## 2022-03-13 ENCOUNTER — Other Ambulatory Visit: Payer: Self-pay

## 2022-03-13 DIAGNOSIS — N289 Disorder of kidney and ureter, unspecified: Secondary | ICD-10-CM | POA: Diagnosis not present

## 2022-03-13 DIAGNOSIS — M7989 Other specified soft tissue disorders: Secondary | ICD-10-CM | POA: Diagnosis not present

## 2022-03-13 DIAGNOSIS — R7402 Elevation of levels of lactic acid dehydrogenase (LDH): Secondary | ICD-10-CM | POA: Diagnosis not present

## 2022-03-13 DIAGNOSIS — M79669 Pain in unspecified lower leg: Secondary | ICD-10-CM | POA: Insufficient documentation

## 2022-03-13 DIAGNOSIS — E871 Hypo-osmolality and hyponatremia: Secondary | ICD-10-CM | POA: Insufficient documentation

## 2022-03-13 DIAGNOSIS — R7401 Elevation of levels of liver transaminase levels: Secondary | ICD-10-CM

## 2022-03-13 NOTE — Progress Notes (Deleted)
? ? ? ?  03/13/2022 ?[name] ?[id number] ?[birth year] ? ? ?ASSESSMENT AND PLAN:  ?*** ?There are no diagnoses linked to this encounter. ? ? ?Patient Care Team: ?Leilani Able, MD as PCP - General (Family Medicine) ? ?HISTORY OF PRESENT ILLNESS: ?41 y.o. male referred by Leilani Able, MD, with a past medical history of hypertension and others listed below presents for evaluation of rectal bleeding and bloating.  ?Patient was seen in the ER 12/21/2021 with right flank pain, UA unremarkable. ?12/21/2021 CT stone study unremarkable-normal liver, gallbladder contracted without evidence of gallstones thickening or biliary dilatation.  Normal pancreas, normal spleen, stomach within normal limits, no evidence of bowel wall thickening or distention or inflammatory changes. ?Patient treated with NSAIDs ? ?CBC  12/21/2021  ?HGB 15.4 MCV 93.6 without evidence of anemia ?WBC 6.2 Platelets 259 ?Kidney function 12/21/2021  ?BUN 17 Cr 1.30  ?GFR >60  ?Potassium 4.3   ?LFTs 12/21/2021  ?AST 42 ALT 21 ?Alkphos 74 TBili 0.4 ? ? ?Current Medications:  ? ? ? ?Current Outpatient Medications (Respiratory):  ?  benzonatate (TESSALON) 100 MG capsule, Take 1 capsule (100 mg total) by mouth every 8 (eight) hours. ?  fluticasone (FLONASE) 50 MCG/ACT nasal spray, Place 1 spray into both nostrils daily. ? ?Current Outpatient Medications (Analgesics):  ?  naproxen (NAPROSYN) 500 MG tablet, Take 1 tablet (500 mg total) by mouth 2 (two) times daily. ? ? ?Current Outpatient Medications (Other):  ?  cyclobenzaprine (FLEXERIL) 10 MG tablet, Take 1 tablet (10 mg total) by mouth 2 (two) times daily as needed for muscle spasms. ?  gabapentin (NEURONTIN) 100 MG capsule, Take 1 capsule (100 mg total) by mouth 3 (three) times daily. ?  Lidocaine (HM LIDOCAINE PATCH) 4 % PTCH, Apply 1 patch topically every 12 (twelve) hours as needed. ?  methocarbamol (ROBAXIN) 500 MG tablet, Take 1 tablet (500 mg total) by mouth 2 (two) times daily. ? ?Medical History:  ?Past  Medical History:  ?Diagnosis Date  ? Hypertension   ? ?Allergies: No Known Allergies  ? ?Surgical History:  ?He  has no past surgical history on file. ?Family History:  ?His family history is not on file. ?Social History:  ? reports that he has been smoking cigarettes. He has never used smokeless tobacco. He reports current alcohol use. He reports that he does not currently use drugs. ? ?REVIEW OF SYSTEMS  : All other systems reviewed and negative except where noted in the History of Present Illness. ? ? ?PHYSICAL EXAM: ?There were no vitals taken for this visit. ?General:   Pleasant, well developed male in no acute distress ?Head:  Normocephalic and atraumatic. ?Eyes: {sclerae:26738},conjunctive {conjuctiva:26739}  ?Heart:  {HEART EXAM HEM/ONC:21750} ?Pulm: Clear anteriorly; no wheezing ?Abdomen:  {BlankSingle:19197::"Distended","Ridged","Soft"}, {BlankSingle:19197::"Flat","Obese"} AB, skin exam {ABDOMEN SKIN EXAM:22649}, {BlankSingle:19197::"Absent","Hyperactive, tinkling","Hypoactive","Sluggish","Normal"} bowel sounds. {Desc; pc desc - abdomen tenderness:5168} tenderness {anatomy; site abdomen:5010}. {BlankMultiple:19196::"Without guarding","With guarding","Without rebound","With rebound"}, {Exam; abdomen organomegaly:15152}. ?Extremities:  {With/Without:304960234} edema. ?Msk:  Symmetrical without gross deformities. Peripheral pulses intact.  ?Neurologic:  Alert and  oriented x4;  grossly normal neurologically. ?Skin:   Dry and intact without significant lesions or rashes. ?Psychiatric: Demonstrates good judgement and reason without abnormal affect or behaviors. ? ? ?Doree Albee, PA-C ?1:22 PM ? ? ?

## 2022-03-13 NOTE — ED Triage Notes (Addendum)
Patient presents with complaint of BLE edema and discoloration.  First noticed on Thursday and has gotten progressively worse.  Works on his feet for extended periods of time.   ?

## 2022-03-14 ENCOUNTER — Ambulatory Visit: Payer: Medicaid Other | Admitting: Physical Therapy

## 2022-03-14 LAB — URINALYSIS, ROUTINE W REFLEX MICROSCOPIC
Bilirubin Urine: NEGATIVE
Glucose, UA: NEGATIVE mg/dL
Ketones, ur: NEGATIVE mg/dL
Leukocytes,Ua: NEGATIVE
Nitrite: NEGATIVE
Protein, ur: 30 mg/dL — AB
Specific Gravity, Urine: 1.02 (ref 1.005–1.030)
pH: 5 (ref 5.0–8.0)

## 2022-03-14 LAB — CBC WITH DIFFERENTIAL/PLATELET
Abs Immature Granulocytes: 0.01 10*3/uL (ref 0.00–0.07)
Basophils Absolute: 0 10*3/uL (ref 0.0–0.1)
Basophils Relative: 0 %
Eosinophils Absolute: 0.2 10*3/uL (ref 0.0–0.5)
Eosinophils Relative: 2 %
HCT: 41.9 % (ref 39.0–52.0)
Hemoglobin: 14.5 g/dL (ref 13.0–17.0)
Immature Granulocytes: 0 %
Lymphocytes Relative: 22 %
Lymphs Abs: 1.5 10*3/uL (ref 0.7–4.0)
MCH: 31 pg (ref 26.0–34.0)
MCHC: 34.6 g/dL (ref 30.0–36.0)
MCV: 89.7 fL (ref 80.0–100.0)
Monocytes Absolute: 0.7 10*3/uL (ref 0.1–1.0)
Monocytes Relative: 10 %
Neutro Abs: 4.6 10*3/uL (ref 1.7–7.7)
Neutrophils Relative %: 66 %
Platelets: 308 10*3/uL (ref 150–400)
RBC: 4.67 MIL/uL (ref 4.22–5.81)
RDW: 13 % (ref 11.5–15.5)
WBC: 7 10*3/uL (ref 4.0–10.5)
nRBC: 0 % (ref 0.0–0.2)

## 2022-03-14 LAB — COMPREHENSIVE METABOLIC PANEL
ALT: 19 U/L (ref 0–44)
AST: 54 U/L — ABNORMAL HIGH (ref 15–41)
Albumin: 4.2 g/dL (ref 3.5–5.0)
Alkaline Phosphatase: 114 U/L (ref 38–126)
Anion gap: 12 (ref 5–15)
BUN: 13 mg/dL (ref 6–20)
CO2: 23 mmol/L (ref 22–32)
Calcium: 8.9 mg/dL (ref 8.9–10.3)
Chloride: 97 mmol/L — ABNORMAL LOW (ref 98–111)
Creatinine, Ser: 1.47 mg/dL — ABNORMAL HIGH (ref 0.61–1.24)
GFR, Estimated: >60 mL/min (ref >60)
Glucose, Bld: 105 mg/dL — ABNORMAL HIGH (ref 70–99)
Potassium: 3.5 mmol/L (ref 3.5–5.1)
Sodium: 132 mmol/L — ABNORMAL LOW (ref 135–145)
Total Bilirubin: 0.5 mg/dL (ref 0.3–1.2)
Total Protein: 8.3 g/dL — ABNORMAL HIGH (ref 6.5–8.1)

## 2022-03-14 LAB — URINALYSIS, MICROSCOPIC (REFLEX)

## 2022-03-14 LAB — PROTIME-INR
INR: 1 (ref 0.8–1.2)
Prothrombin Time: 13.1 seconds (ref 11.4–15.2)

## 2022-03-14 LAB — APTT: aPTT: 29 seconds (ref 24–36)

## 2022-03-14 LAB — SEDIMENTATION RATE: Sed Rate: 41 mm/hr — ABNORMAL HIGH (ref 0–16)

## 2022-03-14 MED ORDER — ACETAMINOPHEN 325 MG PO TABS
650.0000 mg | ORAL_TABLET | Freq: Once | ORAL | Status: AC
  Administered 2022-03-14: 650 mg via ORAL
  Filled 2022-03-14: qty 2

## 2022-03-14 MED ORDER — PREDNISONE 50 MG PO TABS
60.0000 mg | ORAL_TABLET | Freq: Once | ORAL | Status: AC
  Administered 2022-03-14: 60 mg via ORAL
  Filled 2022-03-14: qty 1

## 2022-03-14 MED ORDER — PREDNISONE 50 MG PO TABS: 50.0000 mg | ORAL_TABLET | Freq: Every day | ORAL | 0 refills | Status: DC

## 2022-03-14 MED ORDER — FUROSEMIDE 40 MG PO TABS: 40.0000 mg | ORAL_TABLET | Freq: Every day | ORAL | 0 refills | Status: DC

## 2022-03-14 MED ORDER — IBUPROFEN 400 MG PO TABS
400.0000 mg | ORAL_TABLET | Freq: Once | ORAL | Status: AC
  Administered 2022-03-14: 400 mg via ORAL
  Filled 2022-03-14: qty 1

## 2022-03-14 MED ORDER — HYDROCODONE-ACETAMINOPHEN 5-325 MG PO TABS: 1.0000 | ORAL_TABLET | ORAL | 0 refills | Status: DC | PRN

## 2022-03-14 NOTE — Discharge Instructions (Signed)
I believe that the swelling in your legs is secondary to an inflammatory process.  You have been given a prescription for prednisone to try to reduce the inflammation, and a prescription for furosemide to try to get rid of extra fluid.  You may take ibuprofen and/or acetaminophen for pain, reserve hydrocodone-acetaminophen for more severe pain.  It is very important that you follow-up with your primary care provider in the next 5-7 days for reevaluation.  If symptoms or not improving, you will need to see a rheumatologist (arthritis specialist) for further evaluation. ?

## 2022-03-14 NOTE — ED Provider Notes (Signed)
?[facility] EMERGENCY DEPARTMENT ?Provider Note ? ? ?CSN: [id number] ?Arrival date & time: 03/13/22  2315 ? ?  ? ?History ? ?Chief Complaint  ?Patient presents with  ? Leg Swelling  ? ? ?Glenn Weber is a 41 y.o. male. ? ?The history is provided by the patient.  ?He has history of hypertension and comes in because of swelling in his feet and lower legs over the last 4 days.  During that same time, he has noted pain in that area when he has noted bruises come up without any history of trauma.  He denies any bruising anywhere else.  He denies fever chills.  He denies chest pain or difficulty breathing.  Of note, he takes amlodipine for high blood pressure but ran out of his usual dose and has been taking the lower for the last 2 weeks. ?  ?Home Medications ?Prior to Admission medications   ?Medication Sig Start Date End Date Taking? Authorizing Provider  ?benzonatate (TESSALON) 100 MG capsule Take 1 capsule (100 mg total) by mouth every 8 (eight) hours. 11/23/18   Petrucelli, Samantha R, PA-C  ?cyclobenzaprine (FLEXERIL) 10 MG tablet Take 1 tablet (10 mg total) by mouth 2 (two) times daily as needed for muscle spasms. 12/21/21   Tilden Fossa, MD  ?fluticasone [place]) 50 MCG/ACT nasal spray Place 1 spray into both nostrils daily. 11/23/18   Petrucelli, Samantha R, PA-C  ?gabapentin (NEURONTIN) 100 MG capsule Take 1 capsule (100 mg total) by mouth 3 (three) times daily. 12/05/12   Derwood Kaplan, MD  ?Lidocaine (HM LIDOCAINE PATCH) 4 % PTCH Apply 1 patch topically every 12 (twelve) hours as needed. 07/25/21   Haskel Schroeder, PA-C  ?methocarbamol (ROBAXIN) 500 MG tablet Take 1 tablet (500 mg total) by mouth 2 (two) times daily. 07/25/21   Haskel Schroeder, PA-C  ?naproxen (NAPROSYN) 500 MG tablet Take 1 tablet (500 mg total) by mouth 2 (two) times daily. 11/23/18   Petrucelli, Pleas Koch, PA-C  ?   ? ?Allergies    ?Patient has no known allergies.   ? ?Review of Systems   ?Review of Systems  ?All other  systems reviewed and are negative. ? ?Physical Exam ?Updated Vital Signs ?BP (!) 154/96 (BP Location: Left Arm)   Pulse (!) 103   Temp 98.9 ?F (37.2 ?C) (Oral)   Resp 16   Ht 5\' 6"  (1.676 m)   Wt 81.6 kg   SpO2 98%   BMI 29.05 kg/m?  ?Physical Exam ?Vitals and nursing note reviewed.  ?41 year old male, resting comfortably and in no acute distress. Vital signs are significant for elevated blood pressure and borderline elevated heart rate. Oxygen saturation is 98%, which is normal. ?Head is normocephalic and atraumatic. PERRLA, EOMI. Oropharynx is clear. ?Neck is nontender and supple without adenopathy or JVD. ?Back is nontender and there is no CVA tenderness. ?Lungs are clear without rales, wheezes, or rhonchi. ?Chest is nontender. ?Heart has regular rate and rhythm without murmur. ?Abdomen is soft, flat, nontender. ?Extremities have 1+ pretibial and pedal edema, full range of motion is present.  Faint ecchymoses are noted over the distal lower legs bilaterally. ?Skin is warm and dry without rash. ?Neurologic: Mental status is normal, cranial nerves are intact, moves all extremities equally. ? ?ED Results / Procedures / Treatments   ?Labs ?(all labs ordered are listed, but only abnormal results are displayed) ?Labs Reviewed  ?COMPREHENSIVE METABOLIC PANEL - Abnormal; Notable for the following components:  ?  Result Value  ? Sodium 132 (*)   ? Chloride 97 (*)   ? Glucose, Bld 105 (*)   ? Creatinine, Ser 1.47 (*)   ? Total Protein 8.3 (*)   ? AST 54 (*)   ? All other components within normal limits  ?SEDIMENTATION RATE - Abnormal; Notable for the following components:  ? Sed Rate 41 (*)   ? All other components within normal limits  ?URINALYSIS, ROUTINE W REFLEX MICROSCOPIC - Abnormal; Notable for the following components:  ? Hgb urine dipstick SMALL (*)   ? Protein, ur 30 (*)   ? All other components within normal limits  ?URINALYSIS, MICROSCOPIC (REFLEX) - Abnormal; Notable for the following components:  ?  Bacteria, UA RARE (*)   ? All other components within normal limits  ?CBC WITH DIFFERENTIAL/PLATELET  ?PROTIME-INR  ?APTT  ? ?Procedures ?Procedures  ? ? ?Medications Ordered in ED ?Medications - No data to display ? ?ED Course/ Medical Decision Making/ A&P ?  ?                        ?Medical Decision Making ?Amount and/or Complexity of Data Reviewed ?Labs: ordered. ? ?Risk ?OTC drugs. ?Prescription drug management. ? ? ?Peripheral edema with ecchymosis concerning for possible Henoch-Sch?nlein purpura.  No petechiae seen.  We will check screening labs including coagulation studies and urinalysis.  Old records are reviewed, and he has no relevant past visits. ? ?Labs show mild renal insufficiency which is unchanged from baseline, mild hyponatremia which is not felt to be clinically significant.  WBC is normal.  Sedimentation rate is mildly elevated at 41.  Albumin level is normal and urinalysis shows minimal proteinuria which is most likely related to hypertension.  At this point, specific cause is not clear, but I do suspect that this is inflammatory.  He is discharged with a short course of prednisone as well as furosemide.  He is also given prescription for small number of hydrocodone-acetaminophen tablets for pain.  He is referred back to his primary care provider for further evaluation and consideration for rheumatology referral. ? ?Final Clinical Impression(s) / ED Diagnoses ?Final diagnoses:  ?Pain and swelling of lower leg, unspecified laterality  ?Hyponatremia  ?Elevated AST (SGOT)  ?Renal insufficiency  ? ? ?Rx / DC Orders ?ED Discharge Orders   ? ?      Ordered  ?  predniSONE (DELTASONE) 50 MG tablet  Daily       ? 03/14/22 0412  ?  HYDROcodone-acetaminophen (NORCO) 5-325 MG tablet  Every 4 hours PRN       ? 03/14/22 0412  ?  furosemide (LASIX) 40 MG tablet  Daily       ? 03/14/22 0412  ? ?  ?  ? ?  ? ? ?  ?Dione Booze, MD ?03/14/22 [id number] ? ?

## 2022-03-20 ENCOUNTER — Encounter: Payer: Self-pay | Admitting: Gastroenterology

## 2022-03-20 ENCOUNTER — Encounter: Payer: Self-pay | Admitting: Physical Therapy

## 2022-03-20 ENCOUNTER — Ambulatory Visit: Payer: Medicaid Other | Attending: Orthopedic Surgery | Admitting: Physical Therapy

## 2022-03-20 DIAGNOSIS — M5412 Radiculopathy, cervical region: Secondary | ICD-10-CM | POA: Diagnosis present

## 2022-03-20 DIAGNOSIS — M62838 Other muscle spasm: Secondary | ICD-10-CM

## 2022-03-20 DIAGNOSIS — M6281 Muscle weakness (generalized): Secondary | ICD-10-CM

## 2022-03-20 DIAGNOSIS — M5414 Radiculopathy, thoracic region: Secondary | ICD-10-CM | POA: Diagnosis present

## 2022-03-20 NOTE — Therapy (Signed)
[place] ?[facility] ?[address] ?[place], [place], [address] ?Phone: [phone]   Fax:  [phone] ? ?Physical Therapy Treatment ? ?Patient Details  ?Name: Glenn Weber ?MRN: [id number] ?Date of Birth: [birth year] ?Referring Provider (PT): Haskel Schroeder ? ? ?Encounter Date: 03/20/2022 ? ? PT End of Session - 03/20/22 1626   ? ? Visit Number 6   ? PT Start Time 1545   ? PT Stop Time 1625   ? PT Time Calculation (min) 40 min   ? Activity Tolerance Patient tolerated treatment well   ? Behavior During Therapy [facility] for tasks assessed/performed   ? ?  ?  ? ?  ? ? ?Past Medical History:  ?Diagnosis Date  ? Hypertension   ? ? ?History reviewed. No pertinent surgical history. ? ?There were no vitals filed for this visit. ? ? Subjective Assessment - 03/20/22 1546   ? ? Subjective Overall doing better, but he still has flare ups. 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 He is starting his third week there and the pain has improved each week. He had an episode of B foot swelling and went to urgent care. He has a follow up scheduled with his regular Dr for next week.   ? Pertinent History HTN, H/O L neck and shoulder injury several years ago with radiating pain.   ? Currently in Pain? No/denies   ? ?  ?  ? ?  ? ? ? ? ? ? ? ? ? ? ? ? ? ? ? ? ? ? ? ? [facility] Adult PT Treatment/Exercise - 03/20/22 0001   ? ?  ? Shoulder Exercises: Standing  ? Other Standing Exercises Facing wall, red Tband around wrists, resisted movements, out and back, up and down, diagonals, 10 reps each direction.   ?  ? Shoulder Exercises: Stretch  ? Corner Stretch 2 reps;20 seconds   ? Corner Stretch Limitations doorway stretch at 120, 90, 60 degrees.   ? Cross Chest Stretch 3 reps;10 seconds   ? Cross Chest Stretch Limitations thoracic flexion an drotation to each side with arms crossed across shoulders.   ? Other Shoulder Stretches Doorway stretch for UP Traps  and Rhomboids, 2 x 20 seconds.   ?  ? Shoulder Exercises: Power Tower  ? Other Power Museum/gallery curator Lat pull downs- 55#, 2 x 10 reps   ?  ? Manual Therapy  ? Manual Therapy Soft tissue mobilization;Passive ROM   ? Soft tissue mobilization UT, rhomboids, Posterior cervical para spinales   ? Passive ROM cervical rotation B, chin tuck, scapular abd   ? ?  ?  ? ?  ? ? ? ? ? ? ? ? ? ? ? ? PT Short Term Goals - 01/18/22 1725   ? ?  ? PT SHORT TERM GOAL #1  ? Title I with inital HEP.   ? Baseline HEP updated today.   ? Time 2   ? Period Weeks   ? Status On-going   ? Target Date 02/02/22   ? ?  ?  ? ?  ? ? ? ? PT Long Term Goals - 03/20/22 1622   ? ?  ? PT LONG TERM GOAL #1  ? Title I with final HEP   ? Time 2   ? Period Weeks   ? Status On-going   ? Target Date 04/03/22   ?  ? PT LONG  TERM GOAL #2  ? Title Patient will recover 90% ROM in all thoraco-lumbar mobility, in all planes, with no increased pain.   ? Status Achieved   ?  ? PT LONG TERM GOAL #3  ? Title Patient will verbalize and demosntrate appropriate lifting technqiues to minimize strain on back.   ? Status Achieved   ?  ? PT LONG TERM GOAL #4  ? Title Patient will return to job x at least 6 hours with report of pain < 4/10.   ? Status On-going   ? ?  ?  ? ?  ? ? ? ? ? ? ? ? Plan - 03/20/22 1617   ? ? 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 he feels overall better, still has some irritation, which exacerbated when he started the new job, but his second week  was better than his first. He was given some lifitng instructions. Therapsit performed STM to upper trunk and neck with stretch, then had him do some passive stretching for his pects and rhomboids F/B continued upper body strengthening.   ? Personal Factors and Comorbidities Past/Current Experience   ? Examination-Activity Limitations Bend;Lift;Sleep;Carry;Reach Overhead   ? Examination-Participation  Restrictions Occupation;Interpersonal Relationship;Cleaning;Driving   ? Stability/Clinical Decision Making Evolving/Moderate complexity   ? Clinical Decision Making Moderate   ? Rehab Potential Good   ? PT Frequency 2x / week   ? PT Duration 2 weeks   ? PT Treatment/Interventions ADLs/Self Care Home Management;Cryotherapy;Electrical Stimulation;Iontophoresis 4mg /ml Dexamethasone;Moist Heat;Traction;Gait training;Functional mobility training;Therapeutic exercise;Balance training;Patient/family education;Manual techniques;Therapeutic activities;Neuromuscular re-education;Dry needling;Passive range of motion;Joint Manipulations;Spinal Manipulations   ? PT Next Visit Plan Progress with strengthening and stretch. Update HEP with doorwary stretch, rhomboid stretch.   ? Consulted and Agree with Plan of Care Patient   ? ?  ?  ? ?  ? ? ?Patient will benefit from skilled therapeutic intervention in order to improve the following deficits and impairments:  Decreased activity tolerance, Decreased strength, Impaired UE functional use, Postural dysfunction, Pain, Decreased range of motion, Decreased balance, Decreased endurance, Difficulty walking, Increased muscle spasms ? ?Visit Diagnosis: ?Other muscle spasm ? ?Radiculopathy, cervical region ? ?Radiculopathy, thoracic region ? ?Muscle weakness (generalized) ? ? ? ? ?Problem List ?Patient Active Problem List  ? Diagnosis Date Noted  ? Cervical radiculopathy at C7 12/25/2012  ? ? ?02/22/2013, DPT ?03/20/2022, 4:28 PM ? ?[facility] ?[address] ?[place], [place], [place] ?Phone: [phone]   Fax:  [phone] ? ?Name: Glenn Weber ?MRN: [name] ?Date of Birth: 29-Oct-1981 ? ? ? ?

## 2022-03-22 ENCOUNTER — Ambulatory Visit: Payer: Medicaid Other | Admitting: Physician Assistant

## 2022-04-03 ENCOUNTER — Ambulatory Visit: Payer: Medicaid Other | Admitting: Physical Therapy

## 2022-04-06 ENCOUNTER — Ambulatory Visit: Payer: Medicaid Other | Admitting: Physical Therapy

## 2022-04-06 ENCOUNTER — Encounter: Payer: Self-pay | Admitting: Physical Therapy

## 2022-04-06 DIAGNOSIS — M5414 Radiculopathy, thoracic region: Secondary | ICD-10-CM

## 2022-04-06 DIAGNOSIS — M5412 Radiculopathy, cervical region: Secondary | ICD-10-CM

## 2022-04-06 DIAGNOSIS — M62838 Other muscle spasm: Secondary | ICD-10-CM

## 2022-04-06 DIAGNOSIS — M6281 Muscle weakness (generalized): Secondary | ICD-10-CM

## 2022-04-06 NOTE — Therapy (Signed)
[place] ?[facility] [address] ?[place], [place], [address] ?Phone: [phone]   Fax:  [phone] ? ?Physical Therapy Treatment ? ?Patient Details  ?Name: Glenn Weber ?MRN: [id number] ?Date of Birth: January 25, 1981 ?Referring Provider (PT): Jackson Latino ? ? ?Encounter Date: 04/06/2022 ? ? PT End of Session - 04/06/22 1638   ? ? Visit Number 7   ? PT Start Time 1600   ? PT Stop Time 4917   ? PT Time Calculation (min) 50 min   ? Activity Tolerance Patient tolerated treatment well   ? Behavior During Therapy [facility] for tasks assessed/performed   ? ?  ?  ? ?  ? ? ?Past Medical History:  ?Diagnosis Date  ? Hypertension   ? ? ?History reviewed. No pertinent surgical history. ? ?There were no vitals filed for this visit. ? ? Subjective Assessment - 04/06/22 1603   ? ? Subjective All right, both shoulder has been hurting for about two days.   ? Currently in Pain? Yes   ? Pain Score 7    ? Pain Location Shoulder   ? ?  ?  ? ?  ? ? ? ? ? [facility] PT Assessment - 04/06/22 0001   ? ?  ? AROM  ? Overall AROM  Within functional limits for tasks performed   ? ?  ?  ? ?  ? ? ? ? ? ? ? ? ? ? ? ? ? ? ? ? [facility] Adult PT Treatment/Exercise - 04/06/22 0001   ? ?  ? Neck Exercises: Machines for Strengthening  ? UBE (Upper Arm Bike) L3 x 3 min each   ? Cybex Row 35lb 2x12   ? Cybex Chest Press 20lb 2x12   ? Lat Pull 35lb 2x15   ?  ? Neck Exercises: Standing  ? Other Standing Exercises Shrugs 8lb 2x10 with upper trap and levator stretch   ?  ? Shoulder Exercises: Seated  ? External Rotation Strengthening;Theraband;20 reps;Both   ? Theraband Level (Shoulder External Rotation) Level 3 (Green)   ?  ? Modalities  ? Modalities Moist Heat   ?  ? Moist Heat Therapy  ? Number Minutes Moist Heat 10 Minutes   ? Moist Heat Location Cervical   ? ?  ?  ? ?  ? ? ? ? ? ? ? ? ? ? ? ? PT Short Term Goals - 01/18/22 1725   ? ?  ? PT SHORT TERM GOAL #1  ? Title I with inital HEP.   ? Baseline HEP updated today.   ? Time  2   ? Period Weeks   ? Status On-going   ? Target Date 02/02/22   ? ?  ?  ? ?  ? ? ? ? PT Long Term Goals - 04/06/22 1612   ? ?  ? PT LONG TERM GOAL #1  ? Title I with final HEP   ? Status Partially Met   ?  ? PT LONG TERM GOAL #2  ? Title Patient will recover 90% ROM in all thoraco-lumbar mobility, in all planes, with no increased pain.   ? Status Achieved   ?  ? PT LONG TERM GOAL #3  ? Title Patient will verbalize and demosntrate appropriate lifting technqiues to minimize strain on back.   ? Status Achieved   ?  ? PT LONG TERM GOAL #4  ? Title Patient will return to job x at least 6 hours with report of  pain < 4/10.   ? Status On-going   ? ?  ?  ? ?  ? ? ? ? ? ? ? ? Plan - 04/06/22 1639   ? ? Clinical Impression Statement Pt returns to therapy after more than two weeks reporting increase shoulder pain form working. Pt has improved increasing his lumbar and cervical AROM in all directions. Continued with postural strengthening and stretches. Postural cue required with seated rows and lats. Lead PT assisted in treatment session providing DN.   ? Personal Factors and Comorbidities Past/Current Experience   ? Examination-Activity Limitations Bend;Lift;Sleep;Carry;Reach Overhead   ? Examination-Participation Restrictions Occupation;Interpersonal Relationship;Cleaning;Driving   ? Stability/Clinical Decision Making Evolving/Moderate complexity   ? Rehab Potential Good   ? PT Frequency 2x / week   ? PT Duration 2 weeks   ? PT Treatment/Interventions ADLs/Self Care Home Management;Cryotherapy;Electrical Stimulation;Iontophoresis 75m/ml Dexamethasone;Moist Heat;Traction;Gait training;Functional mobility training;Therapeutic exercise;Balance training;Patient/family education;Manual techniques;Therapeutic activities;Neuromuscular re-education;Dry needling;Passive range of motion;Joint Manipulations;Spinal Manipulations   ? PT Next Visit Plan Progress with strengthening and stretch. Update HEP with doorwary stretch, rhomboid  stretch.   ? ?  ?  ? ?  ? ? ?Patient will benefit from skilled therapeutic intervention in order to improve the following deficits and impairments:  Decreased activity tolerance, Decreased strength, Impaired UE functional use, Postural dysfunction, Pain, Decreased range of motion, Decreased balance, Decreased endurance, Difficulty walking, Increased muscle spasms ? ?Visit Diagnosis: ?Other muscle spasm ? ?Radiculopathy, cervical region ? ?Radiculopathy, thoracic region ? ?Muscle weakness (generalized) ? ? ? ? ?Problem List ?Patient Active Problem List  ? Diagnosis Date Noted  ? Cervical radiculopathy at C7 12/25/2012  ? ? ?RScot Jun PTA ?04/06/2022, 4:44 PM ? ?[facility] ?[facility] ?[place] [address]?Phone: [phone]  Fax:  [phone]? ?Name: Glenn Weber?MRN: [id number]?Date of Birth: 112/08/1981? ? ? ?

## 2022-04-13 ENCOUNTER — Ambulatory Visit: Payer: Medicaid Other | Admitting: Gastroenterology

## 2022-04-13 ENCOUNTER — Telehealth: Payer: Self-pay | Admitting: Gastroenterology

## 2022-04-13 NOTE — Telephone Encounter (Signed)
Good  Afternoon Dr. Barron Alvine,  ? ?Patient called and stated he missed his 3:30 appointment with you today with no reason given. ? ?Patient was rescheduled for 5/24 at 10:20. ?

## 2022-04-18 ENCOUNTER — Ambulatory Visit: Payer: Medicaid Other | Attending: Orthopedic Surgery | Admitting: Physical Therapy

## 2022-05-10 ENCOUNTER — Ambulatory Visit (INDEPENDENT_AMBULATORY_CARE_PROVIDER_SITE_OTHER): Payer: Medicaid Other | Admitting: Gastroenterology

## 2022-05-10 ENCOUNTER — Encounter: Payer: Self-pay | Admitting: Gastroenterology

## 2022-05-10 VITALS — BP 130/94 | HR 70 | Ht 68.0 in | Wt 178.0 lb

## 2022-05-10 DIAGNOSIS — K921 Melena: Secondary | ICD-10-CM

## 2022-05-10 DIAGNOSIS — R12 Heartburn: Secondary | ICD-10-CM

## 2022-05-10 DIAGNOSIS — R7989 Other specified abnormal findings of blood chemistry: Secondary | ICD-10-CM | POA: Diagnosis not present

## 2022-05-10 DIAGNOSIS — R140 Abdominal distension (gaseous): Secondary | ICD-10-CM | POA: Diagnosis not present

## 2022-05-10 MED ORDER — CLENPIQ 10-3.5-12 MG-GM -GM/160ML PO SOLN: 1.0000 | ORAL | 0 refills | Status: DC

## 2022-05-10 NOTE — Patient Instructions (Addendum)
If you are age 43 or younger, your body mass index should be between 19-25. Your Body mass index is 27.06 kg/m. If this is out of the aformentioned range listed, please consider follow up with your Primary Care Provider.   ________________________________________________________  The [facility] providers would like to encourage you to use [facility] to communicate with providers for non-urgent requests or questions.  Due to long hold times on the telephone, sending your provider a message by [facility] may be a faster and more efficient way to get a response.  Please allow 48 business hours for a response.  Please remember that this is for non-urgent requests.  _______________________________________________________  Due to recent changes in healthcare laws, you may see the results of your imaging and laboratory studies on MyChart before your provider has had a chance to review them.  We understand that in some cases there may be results that are confusing or concerning to you. Not all laboratory results come back in the same time frame and the provider may be waiting for multiple results in order to interpret others.  Please give [place] 48 hours in order for your provider to thoroughly review all the results before contacting the office for clarification of your results.    We have sent the following medications to your pharmacy for you to pick up at your convenience: Clenpiq  You have been scheduled for a colonoscopy. Please follow written instructions given to you at your visit today.  Please pick up your prep supplies at the pharmacy within the next 1-3 days. If you use inhalers (even only as needed), please bring them with you on the day of your procedure.    Thank you for choosing me and [facility].  Vito Cirigliano, D.O.

## 2022-05-10 NOTE — Progress Notes (Unsigned)
Chief Complaint: Hematochezia, bloating, GERD   Referring Provider:     Leilani Able, MD   HPI:     Glenn Weber is a 41 y.o. male with a history of HTN referred to the Gastroenterology Clinic for evaluation of hematochezia, abdominal bloating, GERD.  Has had intermittent BRBPR for the last few years. Describes as BRB in toilet water and tissue paper. No dyschezia. No associated diarrhea or constipation, but does get a sense of incomplete evacuation.   Separately with reflux sxs for a few years. Index sxs of HB and regurgitation. Worse at night, EtOH, and with heavier foods. Improved with dietary modifications. Has trialed Rolaids with improvement; uses approximately 1-2 times/weeks. No dysphagia.   Finally, also reports intermittent abdominal bloating for a few years. Worse with heavier foods. No nausea/vomiting.    No previous EGD or colonoscopy.  No known family history of CRC, GI malignancy, liver disease, pancreatic disease, or IBD.    -12/21/2021: CT renal protocol: Normal liver, GB, pancreas, spleen, visualized GI tract.     Latest Ref Rng & Units 03/14/2022   12:15 AM 12/21/2021    3:33 AM  CBC  WBC 4.0 - 10.5 K/uL 7.0   6.2    Hemoglobin 13.0 - 17.0 g/dL 49.7   02.6    Hematocrit 39.0 - 52.0 % 41.9   45.0    Platelets 150 - 400 K/uL 308   259         Latest Ref Rng & Units 03/14/2022   12:15 AM 12/21/2021    3:33 AM  CMP  Glucose 70 - 99 mg/dL 378   588    BUN 6 - 20 mg/dL 13   17    Creatinine 5.02 - 1.24 mg/dL 7.74   1.28    Sodium 786 - 145 mmol/L 132   136    Potassium 3.5 - 5.1 mmol/L 3.5   4.3    Chloride 98 - 111 mmol/L 97   103    CO2 22 - 32 mmol/L 23   24    Calcium 8.9 - 10.3 mg/dL 8.9   9.0    Total Protein 6.5 - 8.1 g/dL 8.3   7.4    Total Bilirubin 0.3 - 1.2 mg/dL 0.5   0.4    Alkaline Phos 38 - 126 U/L 114   74    AST 15 - 41 U/L 54   42    ALT 0 - 44 U/L 19   21        Past Medical History:  Diagnosis Date   Hypertension       History reviewed. No pertinent surgical history. Family History  Problem Relation Age of Onset   Colon cancer Neg Hx    Esophageal cancer Neg Hx    Social History   Tobacco Use   Smoking status: Some Days    Packs/day: 0.50    Types: Cigarettes   Smokeless tobacco: Never   Tobacco comments:    social smoker  Vaping Use   Vaping Use: Former  Substance Use Topics   Alcohol use: Yes    Comment: 2-3 times a week   Drug use: Yes    Types: Marijuana   Current Outpatient Medications  Medication Sig Dispense Refill   AMBULATORY NON FORMULARY MEDICATION 2 tablets daily as needed (for working out). Medication Name: Testo booster     metoprolol tartrate (LOPRESSOR) 50 MG  tablet Take 50-100 mg by mouth daily as needed.     Multiple Vitamin (MULTIVITAMIN) tablet Take 1 tablet by mouth daily.     cyclobenzaprine (FLEXERIL) 10 MG tablet Take 1 tablet (10 mg total) by mouth 2 (two) times daily as needed for muscle spasms. (Patient not taking: Reported on 05/10/2022) 14 tablet 0   No current facility-administered medications for this visit.   No Known Allergies   Review of Systems: All systems reviewed and negative except where noted in HPI.     Physical Exam:    Wt Readings from Last 3 Encounters:  05/10/22 178 lb (80.7 kg)  03/13/22 180 lb (81.6 kg)  12/21/21 190 lb (86.2 kg)    BP (!) 130/94   Pulse 70   Ht 5\' 8"  (1.727 m)   Wt 178 lb (80.7 kg)   BMI 27.06 kg/m  Constitutional:  Pleasant, in no acute distress. Psychiatric: Normal mood and affect. Behavior is normal. EENT: Pupils normal.  Conjunctivae are normal. No scleral icterus. Neck supple. No cervical LAD. Cardiovascular: Normal rate, regular rhythm. No edema Pulmonary/chest: Effort normal and breath sounds normal. No wheezing, rales or rhonchi. Abdominal: Soft, nondistended, nontender. Bowel sounds active throughout. There are no masses palpable. No hepatomegaly. Neurological: Alert and oriented to person  place and time. Skin: Skin is warm and dry. No rashes noted.   ASSESSMENT AND PLAN;   1)  - [place] - If increaseing rolaids need, Will do daily Rx - GERD and bloating improved. No EGD now.  - F/u with PCM re: elevated creat on recent ER labs in March and January  February [place], [place], [facility]  05/10/2022, 10:51 AM   05/12/2022, MD

## 2022-05-19 ENCOUNTER — Encounter: Payer: Self-pay | Admitting: Gastroenterology

## 2022-05-21 ENCOUNTER — Encounter: Payer: Self-pay | Admitting: Certified Registered Nurse Anesthetist

## 2022-05-22 ENCOUNTER — Telehealth: Payer: Self-pay | Admitting: Gastroenterology

## 2022-05-22 NOTE — Telephone Encounter (Signed)
Inbound call from patient stating that he is scheduled for a colon procedure on 6/7 and misread his instructions. Patient  had corn on the cob, pinto beans and a salad yesterday. Patient is seeking advice if he needs to reschedule or if he is okay to come in for his procedure. Please advise.

## 2022-05-22 NOTE — Telephone Encounter (Signed)
Called the patient back. Made him aware not to eat any corns, beans, peas, salads etc. moving forward and drink only clear liquids the day prior the procedure Informed the patient to read the instruction and call us with any questions. Patient voiced understanding.

## 2022-05-24 ENCOUNTER — Encounter (HOSPITAL_BASED_OUTPATIENT_CLINIC_OR_DEPARTMENT_OTHER): Payer: Self-pay

## 2022-05-24 ENCOUNTER — Ambulatory Visit: Payer: Medicaid Other | Admitting: Gastroenterology

## 2022-05-24 ENCOUNTER — Emergency Department (HOSPITAL_BASED_OUTPATIENT_CLINIC_OR_DEPARTMENT_OTHER)
Admission: EM | Admit: 2022-05-24 | Discharge: 2022-05-24 | Disposition: A | Discharge status: Home / Self Care | Payer: Medicaid Other | Attending: Emergency Medicine | Admitting: Emergency Medicine

## 2022-05-24 ENCOUNTER — Other Ambulatory Visit: Payer: Self-pay

## 2022-05-24 ENCOUNTER — Encounter: Payer: Self-pay | Admitting: Gastroenterology

## 2022-05-24 ENCOUNTER — Telehealth: Payer: Self-pay

## 2022-05-24 VITALS — BP 191/135 | HR 81 | Temp 98.4°F | Ht 68.0 in | Wt 178.0 lb

## 2022-05-24 DIAGNOSIS — K921 Melena: Secondary | ICD-10-CM

## 2022-05-24 DIAGNOSIS — I10 Essential (primary) hypertension: Secondary | ICD-10-CM | POA: Diagnosis present

## 2022-05-24 DIAGNOSIS — F17200 Nicotine dependence, unspecified, uncomplicated: Secondary | ICD-10-CM | POA: Diagnosis not present

## 2022-05-24 MED ORDER — SODIUM CHLORIDE 0.9 % IV SOLN: 500.0000 mL | Freq: Once | INTRAVENOUS | Status: DC

## 2022-05-24 NOTE — Telephone Encounter (Signed)
Pt was rescheduled to 05/29/22 at 2pm, due to elevated BP upon arrival to [facility] on 05/24/22.

## 2022-05-24 NOTE — Progress Notes (Signed)
Patient presents for colonoscopy today.  Completed bowel preparation without issue.  Unfortunately had been holding his blood pressure medication.  Presents today with BP 202/117.  Repeat 191/135.  Multiple BP readings using manual cuff and bilateral upper extremities with similar elevated BP, particularly elevated diastolic BP.  He does report having headache recently, but also with rhinorrhea and frontal sinus pain so he attributed this to sinusitis. Otherwise no CP, SOB, falls, syncope.   Due to significantly elevated BP, canceling scheduled colonoscopy today in favor of expedited evaluation in ER at [facility] for expedited treatment.  Recommended close follow-up with his PCM for continued hypertension management.  Can reschedule colonoscopy, tentatively 1 month from now with close PCM follow-up for BP management in the interim.  Patient understands these recommendations and appreciative of explanation.  All questions answered and his driver will drive him across the street to the ER.

## 2022-05-24 NOTE — ED Triage Notes (Signed)
Pt states he was going to have a colonoscopy today, however, his blood pressure was very high. Pt denies any symptoms at this time. He does report intermittent headaches and blurred vision. Pt states it has been about 2 days since he last took his BP meds.

## 2022-05-24 NOTE — ED Provider Notes (Signed)
[facility] EMERGENCY DEPT Provider Note   CSN: [id number] Arrival date & time: 05/24/22  1610     History  Chief Complaint  Patient presents with   Hypertension    Glenn Weber is a 41 y.o. male.  Patient sent in from gastroenterology was due to have a colonoscopy today.  Blood pressure was high.  He had not taken his meta propanol medication for the past 2 days.  Was not sure whether he could take it with the bowel prep.  Blood pressure was markedly elevated patient really asymptomatic.  Patient wondered if come in otherwise they told him to get seen at [facility] or here.  Patient denies severe headache any chest pain shortness of breath or any strokelike symptoms.  Patient does have a primary care doctor he can follow-up with.  Past medical history is significant for known history of hypertension and a current smoker.      Home Medications Prior to Admission medications   Medication Sig Start Date End Date Taking? Authorizing Provider  AMBULATORY NON FORMULARY MEDICATION 2 tablets daily as needed (for working out). Medication Name: Testo booster    [provider]  cyclobenzaprine (FLEXERIL) 10 MG tablet Take 1 tablet (10 mg total) by mouth 2 (two) times daily as needed for muscle spasms. Patient not taking: Reported on 05/10/2022 12/21/21   Tilden Fossa, MD  metoprolol tartrate (LOPRESSOR) 50 MG tablet Take 50-100 mg by mouth daily as needed. 03/27/22   [provider]  Multiple Vitamin (MULTIVITAMIN) tablet Take 1 tablet by mouth daily.    [provider]      Allergies    Patient has no known allergies.    Review of Systems   Review of Systems  Constitutional:  Negative for chills and fever.  HENT:  Negative for ear pain and sore throat.   Eyes:  Negative for pain and visual disturbance.  Respiratory:  Negative for cough and shortness of breath.   Cardiovascular:  Negative for chest pain and palpitations.  Gastrointestinal:   Negative for abdominal pain and vomiting.  Genitourinary:  Negative for dysuria and hematuria.  Musculoskeletal:  Negative for arthralgias and back pain.  Skin:  Negative for color change and rash.  Neurological:  Negative for seizures and syncope.  All other systems reviewed and are negative.  Physical Exam Updated Vital Signs BP (!) 179/114   Pulse 88   Temp 98.1 F (36.7 C)   Resp 19   Ht 1.727 m (5\' 8" )   Wt 80.7 kg   SpO2 99%   BMI 27.06 kg/m  Physical Exam Vitals and nursing note reviewed.  Constitutional:      General: He is not in acute distress.    Appearance: Normal appearance. He is well-developed.  HENT:     Head: Normocephalic and atraumatic.  Eyes:     Extraocular Movements: Extraocular movements intact.     Conjunctiva/sclera: Conjunctivae normal.     Pupils: Pupils are equal, round, and reactive to light.  Cardiovascular:     Rate and Rhythm: Normal rate and regular rhythm.     Heart sounds: No murmur heard. Pulmonary:     Effort: Pulmonary effort is normal. No respiratory distress.     Breath sounds: Normal breath sounds. No wheezing, rhonchi or rales.  Abdominal:     Palpations: Abdomen is soft.     Tenderness: There is no abdominal tenderness.  Musculoskeletal:        General: No swelling.  Cervical back: Normal range of motion and neck supple.     Right lower leg: No edema.     Left lower leg: No edema.  Skin:    General: Skin is warm and dry.     Capillary Refill: Capillary refill takes less than 2 seconds.  Neurological:     General: No focal deficit present.     Mental Status: He is alert and oriented to person, place, and time.     Cranial Nerves: No cranial nerve deficit.     Sensory: No sensory deficit.     Motor: No weakness.  Psychiatric:        Mood and Affect: Mood normal.    ED Results / Procedures / Treatments   Labs (all labs ordered are listed, but only abnormal results are displayed) Labs Reviewed - No data to  display  EKG EKG Interpretation  Date/Time:  Wednesday May 24 2022 16:35:26 EDT Ventricular Rate:  82 PR Interval:  135 QRS Duration: 81 QT Interval:  377 QTC Calculation: 441 R Axis:   -27 Text Interpretation: Sinus rhythm Left ventricular hypertrophy Inferior infarct, old Anterior ST elevation, probably due to LVH Lateral leads are also involved Confirmed by Vanetta Mulders [phone]) on 05/24/2022 6:11:05 PM  Radiology No results found.  Procedures Procedures    Medications Ordered in ED Medications - No data to display  ED Course/ Medical Decision Making/ A&P                           Medical Decision Making  Patient completely asymptomatic.  Blood pressure currently 167/97.  Without any intervention.  He does have his metoprolol to take at home.  Patient take that medication follow-up with primary care doctor for recheck of blood pressure and start a daily log.  Patient nontoxic no acute distress.  No concerns for hypertensive emergency. Final Clinical Impression(s) / ED Diagnoses Final diagnoses:  Primary hypertension    Rx / DC Orders ED Discharge Orders     None         Vanetta Mulders, MD 05/24/22 1815

## 2022-05-24 NOTE — Progress Notes (Signed)
VS completed by CW.   Pt's states no medical or surgical changes since previsit or office visit.   Procedure cancelled today due to patient's blood pressure being elevated and patient had been off their blood pressure medications for a few days.   Patient was rescheduled for another day.

## 2022-05-24 NOTE — Discharge Instructions (Addendum)
Restart your Lopressor meta propyl all blood pressure medicine keep a daily blood pressure log.  Follow back up with your GI doctor.  Return for any severe headache chest pain or shortness of breath or any strokelike symptoms.  Also follow-up with your primary care doctor to make sure that your blood pressure gets under adequate control.

## 2022-05-24 NOTE — Progress Notes (Signed)
BP checked numerous time with DB/P in 120 to 140 range, MD updated and procedure canceled.  Encouraging pt to go to ED now.

## 2022-05-24 NOTE — ED Notes (Signed)
Discharge instructions reviewed and explained, pt verbalized understanding and had no further questions at d/c. Pt caox4 and ambulatory on departure.

## 2022-05-29 ENCOUNTER — Ambulatory Visit: Payer: Medicaid Other | Admitting: Gastroenterology

## 2022-05-29 ENCOUNTER — Encounter: Payer: Self-pay | Admitting: Gastroenterology

## 2022-05-29 VITALS — BP 181/31 | HR 73 | Temp 97.3°F | Ht 68.0 in | Wt 178.0 lb

## 2022-05-29 MED ORDER — SODIUM CHLORIDE 0.9 % IV SOLN: 500.0000 mL | Freq: Once | INTRAVENOUS | Status: DC

## 2022-05-29 NOTE — Progress Notes (Signed)
Cell phone off per pt  Pt drank 16 oz of water at 12:45 pm 1255- BP right arm- 181/131, left arm 181/119 Pt states he took Metoprolol 100 mg tablet this morning  1305- BP left arm- 173/118  Pt denies headache, chest pain, or discomfort of any kind Dr. Barron Alvine is made aware, ok to proceed at 2:45 pm.  Would like CNRA to evluate.   1333- Glenn Langton CRNA made aware.  She spoke with Dr. Barron Alvine- pt needs to follow up with PCP and feel daily blood pressure log.  He needs to call office after follow up with PCP to set up another appointment.    All of these instructions given to pt and understanding voiced.  Emphasized how important it is follow up for high blood pressure.  Recall put in for 1 month so pt will receive letter reminding him of need to schedule colonoscopy.

## 2022-05-29 NOTE — Progress Notes (Signed)
BP again elevated today.  Patient went to the ER last week, and was restarted on his metoprolol.  Has taken that, but never follow-up with his PCM.  Similarly, has not been taking home blood pressure checks as was previously advised by ER physician.  BP today still elevated and still places him at increased risk, particularly since he has not yet followed up with his PCM for titration of medication.  This was discussed with the patient today, and procedure again canceled.  Will not reschedule the appointment until he has a chance to meet with his PCM and get better control of his BP.  When he is able to do that, he will call our office to reschedule.

## 2023-08-02 ENCOUNTER — Inpatient Hospital Stay (HOSPITAL_COMMUNITY)
Admission: EM | Admit: 2023-08-02 | Discharge: 2023-08-04 | DRG: 322 | Disposition: A | Discharge status: Home / Self Care | Payer: Medicaid Other | Source: Ambulatory Visit | Attending: Internal Medicine | Admitting: Internal Medicine

## 2023-08-02 ENCOUNTER — Encounter (HOSPITAL_COMMUNITY): Payer: Self-pay

## 2023-08-02 ENCOUNTER — Emergency Department (HOSPITAL_COMMUNITY): Payer: Medicaid Other

## 2023-08-02 ENCOUNTER — Other Ambulatory Visit: Payer: Self-pay

## 2023-08-02 DIAGNOSIS — Z716 Tobacco abuse counseling: Secondary | ICD-10-CM

## 2023-08-02 DIAGNOSIS — Z79899 Other long term (current) drug therapy: Secondary | ICD-10-CM | POA: Diagnosis not present

## 2023-08-02 DIAGNOSIS — T463X5A Adverse effect of coronary vasodilators, initial encounter: Secondary | ICD-10-CM | POA: Diagnosis present

## 2023-08-02 DIAGNOSIS — Z8249 Family history of ischemic heart disease and other diseases of the circulatory system: Secondary | ICD-10-CM

## 2023-08-02 DIAGNOSIS — I2510 Atherosclerotic heart disease of native coronary artery without angina pectoris: Secondary | ICD-10-CM | POA: Diagnosis not present

## 2023-08-02 DIAGNOSIS — I214 Non-ST elevation (NSTEMI) myocardial infarction: Secondary | ICD-10-CM | POA: Diagnosis not present

## 2023-08-02 DIAGNOSIS — Z720 Tobacco use: Secondary | ICD-10-CM | POA: Insufficient documentation

## 2023-08-02 DIAGNOSIS — G4440 Drug-induced headache, not elsewhere classified, not intractable: Secondary | ICD-10-CM | POA: Diagnosis not present

## 2023-08-02 DIAGNOSIS — I10 Essential (primary) hypertension: Secondary | ICD-10-CM | POA: Diagnosis present

## 2023-08-02 DIAGNOSIS — R079 Chest pain, unspecified: Secondary | ICD-10-CM | POA: Diagnosis not present

## 2023-08-02 DIAGNOSIS — Z7982 Long term (current) use of aspirin: Secondary | ICD-10-CM

## 2023-08-02 DIAGNOSIS — F17210 Nicotine dependence, cigarettes, uncomplicated: Secondary | ICD-10-CM | POA: Diagnosis not present

## 2023-08-02 LAB — CBC
HCT: 44.9 % (ref 39.0–52.0)
Hemoglobin: 15 g/dL (ref 13.0–17.0)
MCH: 32.7 pg (ref 26.0–34.0)
MCHC: 33.4 g/dL (ref 30.0–36.0)
MCV: 97.8 fL (ref 80.0–100.0)
Platelets: 271 10*3/uL (ref 150–400)
RBC: 4.59 MIL/uL (ref 4.22–5.81)
RDW: 14.2 % (ref 11.5–15.5)
WBC: 5.7 10*3/uL (ref 4.0–10.5)
nRBC: 0 % (ref 0.0–0.2)

## 2023-08-02 LAB — BASIC METABOLIC PANEL
Anion gap: 10 (ref 5–15)
BUN: 12 mg/dL (ref 6–20)
CO2: 24 mmol/L (ref 22–32)
Calcium: 8.7 mg/dL — ABNORMAL LOW (ref 8.9–10.3)
Chloride: 106 mmol/L (ref 98–111)
Creatinine, Ser: 1.35 mg/dL — ABNORMAL HIGH (ref 0.61–1.24)
GFR, Estimated: >60 mL/min (ref >60)
Glucose, Bld: 104 mg/dL — ABNORMAL HIGH (ref 70–99)
Potassium: 4.1 mmol/L (ref 3.5–5.1)
Sodium: 140 mmol/L (ref 135–145)

## 2023-08-02 LAB — TROPONIN I (HIGH SENSITIVITY)
Troponin I (High Sensitivity): 646 ng/L (ref <18)
Troponin I (High Sensitivity): 790 ng/L (ref <18)

## 2023-08-02 LAB — ABO/RH: ABO/RH(D): B POS

## 2023-08-02 MED ORDER — IOHEXOL 350 MG/ML SOLN
75.0000 mL | Freq: Once | INTRAVENOUS | Status: AC | PRN
  Administered 2023-08-02: 75 mL via INTRAVENOUS

## 2023-08-02 MED ORDER — HEPARIN BOLUS VIA INFUSION
4000.0000 [IU] | Freq: Once | INTRAVENOUS | Status: AC
  Administered 2023-08-02: 4000 [IU] via INTRAVENOUS
  Filled 2023-08-02: qty 4000

## 2023-08-02 MED ORDER — HEPARIN BOLUS VIA INFUSION: 4000.0000 [IU] | Freq: Once | INTRAVENOUS | Status: DC

## 2023-08-02 MED ORDER — LACTATED RINGERS IV BOLUS
1000.0000 mL | Freq: Once | INTRAVENOUS | Status: AC
  Administered 2023-08-02: 1000 mL via INTRAVENOUS

## 2023-08-02 MED ORDER — ASPIRIN 300 MG RE SUPP: 300.0000 mg | RECTAL | Status: DC

## 2023-08-02 MED ORDER — PROCHLORPERAZINE EDISYLATE 10 MG/2ML IJ SOLN
10.0000 mg | Freq: Once | INTRAMUSCULAR | Status: AC
  Administered 2023-08-03: 10 mg via INTRAVENOUS
  Filled 2023-08-02 (×2): qty 2

## 2023-08-02 MED ORDER — ACETAMINOPHEN 500 MG PO TABS
1000.0000 mg | ORAL_TABLET | Freq: Once | ORAL | Status: AC
  Administered 2023-08-02: 1000 mg via ORAL
  Filled 2023-08-02: qty 2

## 2023-08-02 MED ORDER — ASPIRIN 81 MG PO TBEC
81.0000 mg | DELAYED_RELEASE_TABLET | Freq: Every day | ORAL | Status: DC
  Administered 2023-08-03 – 2023-08-04 (×2): 81 mg via ORAL
  Filled 2023-08-02 (×2): qty 1

## 2023-08-02 MED ORDER — HEPARIN (PORCINE) 25000 UT/250ML-% IV SOLN
1450.0000 [IU]/h | INTRAVENOUS | Status: DC
  Administered 2023-08-02: 1000 [IU]/h via INTRAVENOUS
  Filled 2023-08-02 (×2): qty 250

## 2023-08-02 MED ORDER — ACETAMINOPHEN 325 MG PO TABS
650.0000 mg | ORAL_TABLET | ORAL | Status: DC | PRN
  Administered 2023-08-03 – 2023-08-04 (×2): 650 mg via ORAL
  Filled 2023-08-02 (×2): qty 2

## 2023-08-02 MED ORDER — ASPIRIN 81 MG PO CHEW: 324.0000 mg | CHEWABLE_TABLET | ORAL | Status: DC

## 2023-08-02 MED ORDER — DIPHENHYDRAMINE HCL 50 MG/ML IJ SOLN
12.5000 mg | Freq: Once | INTRAMUSCULAR | Status: AC
  Administered 2023-08-02: 12.5 mg via INTRAVENOUS
  Filled 2023-08-02: qty 1

## 2023-08-02 MED ORDER — ONDANSETRON HCL 4 MG/2ML IJ SOLN: 4.0000 mg | Freq: Four times a day (QID) | INTRAMUSCULAR | Status: DC | PRN

## 2023-08-02 MED ORDER — NITROGLYCERIN IN D5W 200-5 MCG/ML-% IV SOLN
0.0000 ug/min | INTRAVENOUS | Status: DC
  Administered 2023-08-02: 5 ug/min via INTRAVENOUS
  Administered 2023-08-03: 105 ug/min via INTRAVENOUS
  Administered 2023-08-03: 66.667 ug/min via INTRAVENOUS
  Administered 2023-08-03: 20 ug/min via INTRAVENOUS
  Filled 2023-08-02 (×2): qty 250

## 2023-08-02 NOTE — Progress Notes (Signed)
ANTICOAGULATION CONSULT NOTE - Initial Consult  Pharmacy Consult for heparin Indication: chest pain/ACS  No Known Allergies  Patient Measurements: Height: 5\' 8"  (172.7 cm) Weight: 82.6 kg (182 lb) IBW/kg (Calculated) : 68.4 Heparin Dosing Weight: 82.6 kg   Vital Signs: Temp: 98.3 F (36.8 C) (08/15 1620) Temp Source: Oral (08/15 1620) BP: 160/110 (08/15 1615) Pulse Rate: 84 (08/15 1615)  Labs: Recent Labs    08/02/23 1627  HGB 15.0  HCT 44.9  PLT 271  CREATININE 1.35*  TROPONINIHS 646*    Estimated Creatinine Clearance: 73.9 mL/min (A) (by C-G formula based on SCr of 1.35 mg/dL (H)).   Medical History: Past Medical History:  Diagnosis Date   Hypertension     Medications:  Scheduled:  Infusions:   sodium chloride      Assessment: 42 yo male presenting with radiating chest pain. Pharmacy consulted to dose heparin. CBC stable and no s/sx of bleeding noted   Goal of Therapy:  Heparin level 0.3-0.7 units/ml Monitor platelets by anticoagulation protocol: Yes   Plan:  Heparin 4000 units x 1 bolus Then start heparin 1000 units/hr  6 hour heparin level  Monitor daily heparin level and CBC  Monitor for s/sx of bleeding    I  08/02/2023,5:22 PM

## 2023-08-02 NOTE — H&P (Signed)
Cardiology Admission History and Physical   Patient ID: Glenn Weber MRN: [id number]; DOB: 03-17-1981   Admission date: 08/02/2023  PCP:  [facility]   [facility] Providers Cardiologist:  None        Chief Complaint:  Chest pain  Patient Profile:   Glenn Weber is a 42 y.o. male with HTN and tobacco use who is being seen 08/02/2023 for the evaluation of NSTEMI.  History of Present Illness:   Mr. Glenn Weber reports that 2 weeks ago he developed sudden onset chest tightness and pressure while at rest. He had previously been recovering from an episode of bronchitis ~ a month ago and had cough and chest pain associated with this, but his current chest tightness is different. He says that the chest pain is severe and radiates down his arm and up his jaw. It is also associated with indigestion. He initially thought that he was just having indigestion and he expected his symptoms to subside. They continued over the next 2 weeks however, off and on. Symptoms are worse with exertion and he endorses some nausea and SOB. He denies fevers, chill, PND, orthopnea, swelling, palpitations, syncope, diaphoresis. This has never happened to him before.   His family hx is notable for a grandmother with CHF and several cousins with MI's. He endorses occasional tobacco use since he was 42 y/o. Also infrequent ETOH use. Infrequent THC use.   In the ED his VS were afebrile, HR 84, BP 160/110, RR 13 and sats 99% on RA. Labs notable for sCr 1.35 and troponins 646 -> 790. CXR WNL. CTA chest was neg for dissection. ECG with ischemic changes. He was loaded with ASA by EMS and started on heparin and a nitroglycerin gtt in the ED. He is currently chest pain free.    Past Medical History:  Diagnosis Date   Hypertension     History reviewed. No pertinent surgical history.   Medications Prior to Admission: Prior to Admission medications   Medication Sig Start Date End Date Taking?  Authorizing Provider  AMBULATORY NON FORMULARY MEDICATION 2 tablets daily as needed (for working out). Medication Name: Testo booster    [provider]  cyclobenzaprine (FLEXERIL) 10 MG tablet Take 1 tablet (10 mg total) by mouth 2 (two) times daily as needed for muscle spasms. Patient not taking: Reported on 05/10/2022 12/21/21   Glenn Fossa, MD  metoprolol tartrate (LOPRESSOR) 50 MG tablet Take 50-100 mg by mouth daily as needed. 03/27/22   [provider]  Multiple Vitamin (MULTIVITAMIN) tablet Take 1 tablet by mouth daily.    [provider]     Allergies:   No Known Allergies  Social History:   Social History   Socioeconomic History   Marital status: Single    Spouse name: Not on file   Number of children: Not on file   Years of education: Not on file   Highest education level: Not on file  Occupational History   Not on file  Tobacco Use   Smoking status: Some Days    Current packs/day: 0.50    Types: Cigarettes   Smokeless tobacco: Never   Tobacco comments:    social smoker  Vaping Use   Vaping status: Former  Substance and Sexual Activity   Alcohol use: Yes    Comment: 2-3 times a week   Drug use: Yes    Types: Marijuana    Comment: last used this past weekend per pt 05-29-22  Sexual activity: Yes    Birth control/protection: None  Other Topics Concern   Not on file  Social History Narrative   Not on file   Social Determinants of Health   Financial Resource Strain: Not on file  Food Insecurity: Not on file  Transportation Needs: Not on file  Physical Activity: Not on file  Stress: Not on file  Social Connections: Not on file  Intimate Partner Violence: Not on file    Family History:   The patient's family history is negative for Colon cancer and Esophageal cancer.    ROS:  Please see the history of present illness.  All other ROS reviewed and negative.     Physical Exam/Data:   Vitals:   08/02/23 2120 08/02/23 2125  08/02/23 2130 08/02/23 2135  BP: (!) 141/94 (!) 139/91 138/81 (!) 143/88  Pulse: 90 88 87 85  Resp: 18 (!) 23 17 18   Temp:      TempSrc:      SpO2: 99% 97% 96% 97%  Weight:      Height:        Intake/Output Summary (Last 24 hours) at 08/02/2023 2144 Last data filed at 08/02/2023 2141 Gross per 24 hour  Intake 25.08 ml  Output 350 ml  Net -324.92 ml      08/02/2023    4:20 PM 05/29/2022   12:53 PM 05/24/2022    4:18 PM  Last 3 Weights  Weight (lbs) 182 lb 178 lb 178 lb  Weight (kg) 82.555 kg 80.74 kg 80.74 kg     Body mass index is 27.67 kg/m.  General:  Well nourished, well developed, in no acute distress, very pleasant HEENT: normal Neck: no JVD Vascular: No carotid bruits; Distal pulses 2+ bilaterally   Cardiac:  normal S1, S2; RRR; no murmur, rubs or gallops Lungs:  clear to auscultation bilaterally, no wheezing, rhonchi or rales  Abd: soft, nontender, no hepatomegaly  Ext: no edema Musculoskeletal:  No deformities, BUE and BLE strength normal and equal Skin: warm and dry  Neuro:  CNs 2-12 intact, no focal abnormalities noted Psych:  Normal affect    EKG:  The ECG that was done  was personally reviewed and demonstrates ST segment depressions in II, III, aVF, V4-V6 with associated TWIs.    Relevant CV Studies:  CTA 08/02/23:  IMPRESSION: 1. Negative for acute pulmonary embolus or aortic dissection. 2. Clear lungs.  Laboratory Data:  High Sensitivity Troponin:   Recent Labs  Lab 08/02/23 1627 08/02/23 1935  TROPONINIHS 646* 790*      Chemistry Recent Labs  Lab 08/02/23 1627  NA 140  K 4.1  CL 106  CO2 24  GLUCOSE 104*  BUN 12  CREATININE 1.35*  CALCIUM 8.7*  GFRNONAA >60  ANIONGAP 10    No results for input(s): "PROT", "ALBUMIN", "AST", "ALT", "ALKPHOS", "BILITOT" in the last 168 hours. Lipids No results for input(s): "CHOL", "TRIG", "HDL", "LABVLDL", "LDLCALC", "CHOLHDL" in the last 168 hours. Hematology Recent Labs  Lab 08/02/23 1627   WBC 5.7  RBC 4.59  HGB 15.0  HCT 44.9  MCV 97.8  MCH 32.7  MCHC 33.4  RDW 14.2  PLT 271   Thyroid No results for input(s): "TSH", "FREET4" in the last 168 hours. BNPNo results for input(s): "BNP", "PROBNP" in the last 168 hours.  DDimer No results for input(s): "DDIMER" in the last 168 hours.   Radiology/Studies:  CT Angio Chest Aorta W and/or Wo Contrast  Result Date: 08/02/2023 CLINICAL DATA:  Chest pain EXAM: CT ANGIOGRAPHY CHEST WITH CONTRAST TECHNIQUE: Multidetector CT imaging of the chest was performed using the standard protocol during bolus administration of intravenous contrast. Multiplanar CT image reconstructions and MIPs were obtained to evaluate the vascular anatomy. RADIATION DOSE REDUCTION: This exam was performed according to the departmental dose-optimization program which includes automated exposure control, adjustment of the mA and/or kV according to patient size and/or use of iterative reconstruction technique. CONTRAST:  75mL OMNIPAQUE IOHEXOL 350 MG/ML SOLN COMPARISON:  Chest x-ray 08/02/2023 FINDINGS: Cardiovascular: Satisfactory opacification of the pulmonary arteries to the segmental level. No evidence of pulmonary embolism. Normal heart size. No pericardial effusion. Nonaneurysmal aorta. No dissection is seen. Mediastinum/Nodes: No enlarged mediastinal, hilar, or axillary lymph nodes. Thyroid gland, trachea, and esophagus demonstrate no significant findings. Lungs/Pleura: Lungs are clear. No pleural effusion or pneumothorax. Upper Abdomen: No acute abnormality. Musculoskeletal: No chest wall abnormality. No acute or significant osseous findings. Review of the MIP images confirms the above findings. IMPRESSION: 1. Negative for acute pulmonary embolus or aortic dissection. 2. Clear lungs. Electronically Signed   By: Jasmine Pang M.D.   On: 08/02/2023 19:23   DG Chest 2 View  Result Date: 08/02/2023 CLINICAL DATA:  Chest pain EXAM: CHEST - 2 VIEW COMPARISON:   11/23/2018 FINDINGS: The heart size and mediastinal contours are within normal limits. Both lungs are clear. The visualized skeletal structures are unremarkable. IMPRESSION: No active cardiopulmonary disease. Electronically Signed   By: Jasmine Pang M.D.   On: 08/02/2023 19:16     Assessment and Plan:   Glenn Weber is a 42 y.o. male with HTN and tobacco use who is being seen 08/02/2023 for the evaluation of NSTEMI.  #Type 1 NSTEMI ::Patient presenting with classic angina found to have elevated troponins and ECG changes. His presentation is c/w ACS. Risk factors include HTN and tobacco use. Plan for echo and LHC. Ongoing treatment for ACS as described below. Will need secondary prevention and cardiac rehab assuming CAD is identified. -s/p ASA load -start ASA 81 mg daily -start atorvastatin 40 mg daily pending lipids -continue heparin per pharmacy -continue nitroglycerin gtt titrated to chest pain free -plan for LHC in AM -complete TTE -lipid panel, Lp(a), TSH -cardiac rehab at discharge  #HTN -continue home losartan 100 mg daily -continue metoprolol tartrate 50 mg daily  #Occasional Tobacco and THC Use -Counsel patient on discontinuing use of both tobacco and THC as they increase CAD risk   Risk Assessment/Risk Scores:    TIMI Risk Score for Unstable Angina or Non-ST Elevation MI:   The patient's TIMI risk score is 3, which indicates a 13% risk of all cause mortality, new or recurrent myocardial infarction or need for urgent revascularization in the next 14 days.      Code Status: Full Code  Severity of Illness: The appropriate patient status for this patient is INPATIENT. Inpatient status is judged to be reasonable and necessary in order to provide the required intensity of service to ensure the patient's safety. The patient's presenting symptoms, physical exam findings, and initial radiographic and laboratory data in the context of their chronic comorbidities is felt to  place them at high risk for further clinical deterioration. Furthermore, it is not anticipated that the patient will be medically stable for discharge from the hospital within 2 midnights of admission.   * I certify that at the point of admission it is my clinical judgment that the patient will require inpatient hospital care spanning beyond 2 midnights from  the point of admission due to high intensity of service, high risk for further deterioration and high frequency of surveillance required.*   For questions or updates, please contact [facility] Please consult [email or web address] for contact info under     Signed, Karl Ito, MD  08/02/2023 9:44 PM

## 2023-08-02 NOTE — ED Notes (Signed)
ED TO INPATIENT HANDOFF REPORT  ED Nurse Name and Phone #: Juliette Alcide RN [phone]  S Name/Age/Gender Glenn Weber 42 y.o. male Room/Bed: 015C/015C  Code Status   Code Status: Full Code  Home/SNF/Other Home Patient oriented to: self, place, time, and situation Is this baseline? Yes   Triage Complete: Triage complete  Chief Complaint NSTEMI (non-ST elevated myocardial infarction) [facility]) [I21.4]  Triage Note Patient BIB [facility] from doctors office for chest pain x2 weeks radiating to left arm. Patient reports it feels like tightening and squeezing and started after bronchitis. It is worse after he eats and radiates to his jaw. 174/110 after nitroglycerin and 324 mg aspirin. 18 L AC     Allergies No Known Allergies  Level of Care/Admitting Diagnosis ED Disposition     ED Disposition  Admit   Condition  --   Comment  Hospital Area: [facility] [[id number]]  Level of Care: Progressive [102]  Admit to Progressive based on following criteria: CARDIOVASCULAR & THORACIC of moderate stability with acute coronary syndrome symptoms/low risk myocardial infarction/hypertensive urgency/arrhythmias/heart failure potentially compromising stability and stable post cardiovascular intervention patients.  May admit patient to [facility] or [facility] if equivalent level of care is available:: No  Covid Evaluation: Asymptomatic - no recent exposure (last 10 days) testing not required  Diagnosis: NSTEMI (non-ST elevated myocardial infarction) [facility]) [[id number]]  Admitting Physician: Karl Ito [[id number]]  Attending Physician: Maisie Fus [id number]  Certification:: I certify this patient will need inpatient services for at least 2 midnights  Expected Medical Readiness: 08/05/2023          B Medical/Surgery History Past Medical History:  Diagnosis Date   Hypertension    History reviewed. No pertinent surgical history.   A IV Location/Drains/Wounds Patient  Lines/Drains/Airways Status     Active Line/Drains/Airways     Name Placement date Placement time Site Days   Peripheral IV 08/02/23 18 G Left Antecubital 08/02/23  1618  Antecubital  less than 1   Peripheral IV 08/02/23 20 G Right Antecubital 08/02/23  1831  Antecubital  less than 1   Peripheral IV 08/02/23 20 G 1" Left Forearm 08/02/23  1939  Forearm  less than 1            Intake/Output Last 24 hours  Intake/Output Summary (Last 24 hours) at 08/02/2023 2205 Last data filed at 08/02/2023 2159 Gross per 24 hour  Intake 31.44 ml  Output 350 ml  Net -318.56 ml    Labs/Imaging Results for orders placed or performed during the hospital encounter of 08/02/23 (from the past 48 hour(s))  Basic metabolic panel     Status: Abnormal   Collection Time: 08/02/23  4:27 PM  Result Value Ref Range   Sodium 140 135 - 145 mmol/L   Potassium 4.1 3.5 - 5.1 mmol/L   Chloride 106 98 - 111 mmol/L   CO2 24 22 - 32 mmol/L   Glucose, Bld 104 (H) 70 - 99 mg/dL    Comment: Glucose reference range applies only to samples taken after fasting for at least 8 hours.   BUN 12 6 - 20 mg/dL   Creatinine, Ser 2.99 (H) 0.61 - 1.24 mg/dL   Calcium 8.7 (L) 8.9 - 10.3 mg/dL   GFR, Estimated >37 >16 mL/min    Comment: (NOTE) Calculated using the CKD-EPI Creatinine Equation (2021)    Anion gap 10 5 - 15    Comment: Performed at [facility], [address],  [place], [place] [address]  CBC     Status: None   Collection Time: 08/02/23  4:27 PM  Result Value Ref Range   WBC 5.7 4.0 - 10.5 K/uL   RBC 4.59 4.22 - 5.81 MIL/uL   Hemoglobin 15.0 13.0 - 17.0 g/dL   HCT 84.1 66.0 - 63.0 %   MCV 97.8 80.0 - 100.0 fL   MCH 32.7 26.0 - 34.0 pg   MCHC 33.4 30.0 - 36.0 g/dL   RDW 16.0 10.9 - 32.3 %   Platelets 271 150 - 400 K/uL   nRBC 0.0 0.0 - 0.2 %    Comment: Performed at [facility], [address], [place], [place] [address]  Troponin I (High Sensitivity)     Status: Abnormal   Collection Time:  08/02/23  4:27 PM  Result Value Ref Range   Troponin I (High Sensitivity) 646 (HH) <18 ng/L    Comment: CRITICAL RESULT CALLED TO, READ BACK BY AND VERIFIED WITH P, BLANCHARD RN @1717  ON 08/02/23 BY MAB MT (NOTE) Elevated high sensitivity troponin I (hsTnI) values and significant  changes across serial measurements may suggest ACS but many other  chronic and acute conditions are known to elevate hsTnI results.  Refer to the "Links" section for chest pain algorithms and additional  guidance. Performed at [facility], [address] [place], [place] [address]   ABO/Rh     Status: None (Preliminary result)   Collection Time: 08/02/23  4:27 PM  Result Value Ref Range   ABO/RH(D) PENDING   Troponin I (High Sensitivity)     Status: Abnormal   Collection Time: 08/02/23  7:35 PM  Result Value Ref Range   Troponin I (High Sensitivity) 790 (HH) <18 ng/L    Comment: CRITICAL VALUE NOTED. VALUE IS CONSISTENT WITH PREVIOUSLY REPORTED/CALLED VALUE (NOTE) Elevated high sensitivity troponin I (hsTnI) values and significant  changes across serial measurements may suggest ACS but many other  chronic and acute conditions are known to elevate hsTnI results.  Refer to the "Links" section for chest pain algorithms and additional  guidance. Performed at [facility], [address] [place], [place] [address]    CT Angio Chest Aorta W and/or Wo Contrast  Result Date: 08/02/2023 CLINICAL DATA:  Chest pain EXAM: CT ANGIOGRAPHY CHEST WITH CONTRAST TECHNIQUE: Multidetector CT imaging of the chest was performed using the standard protocol during bolus administration of intravenous contrast. Multiplanar CT image reconstructions and MIPs were obtained to evaluate the vascular anatomy. RADIATION DOSE REDUCTION: This exam was performed according to the departmental dose-optimization program which includes automated exposure control, adjustment of the mA and/or kV according to patient size and/or use  of iterative reconstruction technique. CONTRAST:  75mL OMNIPAQUE IOHEXOL 350 MG/ML SOLN COMPARISON:  Chest x-ray 08/02/2023 FINDINGS: Cardiovascular: Satisfactory opacification of the pulmonary arteries to the segmental level. No evidence of pulmonary embolism. Normal heart size. No pericardial effusion. Nonaneurysmal aorta. No dissection is seen. Mediastinum/Nodes: No enlarged mediastinal, hilar, or axillary lymph nodes. Thyroid gland, trachea, and esophagus demonstrate no significant findings. Lungs/Pleura: Lungs are clear. No pleural effusion or pneumothorax. Upper Abdomen: No acute abnormality. Musculoskeletal: No chest wall abnormality. No acute or significant osseous findings. Review of the MIP images confirms the above findings. IMPRESSION: 1. Negative for acute pulmonary embolus or aortic dissection. 2. Clear lungs. Electronically Signed   By: Jasmine Pang M.D.   On: 08/02/2023 19:23   DG Chest 2 View  Result Date: 08/02/2023 CLINICAL DATA:  Chest pain EXAM: CHEST - 2 VIEW COMPARISON:  11/23/2018 FINDINGS: The heart size and mediastinal contours are within normal limits. Both lungs are clear. The visualized skeletal structures are unremarkable. IMPRESSION: No active cardiopulmonary disease. Electronically Signed   By: Jasmine Pang M.D.   On: 08/02/2023 19:16    Pending Labs Unresulted Labs (From admission, onward)     Start     Ordered   08/04/23 0500  Heparin level (unfractionated)  Daily,   R      08/02/23 1729   08/03/23 0500  CBC  Daily,   R      08/02/23 1729   08/03/23 0500  HIV Antibody (routine testing w rflx)  (HIV Antibody (Routine testing w reflex) panel)  Tomorrow morning,   R        08/02/23 2143   08/03/23 0500  Basic metabolic panel  Tomorrow morning,   R        08/02/23 2144   08/03/23 0500  Magnesium  Tomorrow morning,   R        08/02/23 2144   08/03/23 0000  Heparin level (unfractionated)  Once-Timed,   URGENT        08/02/23 1728   08/02/23 2145  Magnesium  Once,    R        08/02/23 2144   08/02/23 2144  Lipoprotein A (LPA)  Once,   R        08/02/23 2143   08/02/23 2144  TSH  Once,   R        08/02/23 2143   08/02/23 2144  Type and screen [facility]  Once,   R       Comments: [facility]    08/02/23 2143   08/02/23 2144  Brain natriuretic peptide  Once,   R        08/02/23 2144   08/02/23 2143  Lipid panel  Once,   R        08/02/23 2143   08/02/23 2143  Hemoglobin A1c  Once,   R        08/02/23 2143            Vitals/Pain Today's Vitals   08/02/23 2140 08/02/23 2145 08/02/23 2150 08/02/23 2155  BP:  (!) 133/95 134/83 (!) 132/97  Pulse:  83 84 87  Resp:  15 19 15   Temp:      TempSrc:      SpO2:  99% 96% 99%  Weight:      Height:      PainSc: 7        Isolation Precautions No active isolations  Medications Medications  heparin ADULT infusion 100 units/mL (25000 units/26mL) (1,000 Units/hr Intravenous New Bag/Given 08/02/23 1822)  nitroGLYCERIN 50 mg in dextrose 5 % 250 mL (0.2 mg/mL) infusion (75 mcg/min Intravenous Rate/Dose Change 08/02/23 2155)  aspirin chewable tablet 324 mg (324 mg Oral Not Given 08/02/23 2157)    Or  aspirin suppository 300 mg ( Rectal See Alternative 08/02/23 2157)  aspirin EC tablet 81 mg (has no administration in time range)  acetaminophen (TYLENOL) tablet 650 mg (has no administration in time range)  ondansetron (ZOFRAN) injection 4 mg (has no administration in time range)  heparin bolus via infusion 4,000 Units (4,000 Units Intravenous Bolus from Bag 08/02/23 1822)  iohexol (OMNIPAQUE) 350 MG/ML injection 75 mL (75 mLs Intravenous Contrast Given 08/02/23 1817)  acetaminophen (TYLENOL) tablet 1,000 mg (1,000 mg Oral Given 08/02/23  2013)    Mobility walks     Focused Assessments Cardiac Assessment Handoff:  Cardiac Rhythm: Normal sinus rhythm No results found for: "CKTOTAL", "CKMB", "CKMBINDEX", "TROPONINI" No results found for: "DDIMER" Does the Patient  currently have chest pain? No    R Recommendations: See Admitting Provider Note  Report given to:   Additional Notes: Pt A&Ox4. Heparin and nitroglycerin drips infusing. Pt continent and using urinal. Ambulatory. Denies chest pain at this time but has headache from nitroglycerin

## 2023-08-02 NOTE — ED Triage Notes (Signed)
Patient BIB [facility] from doctors office for chest pain x2 weeks radiating to left arm. Patient reports it feels like tightening and squeezing and started after bronchitis. It is worse after he eats and radiates to his jaw. 174/110 after nitroglycerin and 324 mg aspirin. 18 L AC

## 2023-08-02 NOTE — ED Provider Notes (Signed)
[place] EMERGENCY DEPARTMENT AT [facility] Provider Note  MDM   HPI/ROS:  Glenn Weber is a 42 y.o. male with recent diagnosis of bronchitis several weeks ago presenting with chief complaint of chest pain.  Patient started to experience pressure-like sensation in the left side of his chest about 2 weeks ago, and feels that this pain worsens when he works out.  Claims the pain is associated with some shortness of breath and also radiates to his left arm and left jaw.  He sometimes feels that the pain in his left arm is associated with some degree of numbness and paresthesias.  Explains that the pain is extreme and has gotten worse throughout the 2-week period.  He presented to his primary care doctor this morning, where EKG was performed and notable for new T wave inversions in lateral leads.  Physical exam is notable for: - Overall well-appearing, no acute distress - Cardiopulmonary exam within normal limits.  Lungs clear to auscultation bilaterally with no accessory or diminished breath sounds - No lower extremity edema  On my initial evaluation, patient is:  -Vital signs stable aside from hypertension with systolic pressures in the 160s. Patient afebrile, hemodynamically stable, and non-toxic appearing. -Additional history obtained from significant other at bedside, chart review  Given the patient's history and physical exam, differential diagnosis includes but is not limited to ACS, PE, dissection, pericarditis/myocarditis, GERD, etc.  Interpretations, interventions, and the patient's course of care are documented below.    Cardiopulmonary workup initiated.  Initial EKG with ST depressions in inferior leads with associated T wave inversions, concerning for ischemia.    Initial troponin 646.  Heparin initiated.  Full dose ASA administered prior to arrival.  Cardiology consulted for admission for NSTEMI.  After conversation with cardiology, concern for aortic dissection.  CT  ordered.  Nitroglycerin administered for pain control.    Upon reassessment, patient resting comfortably with improvement in chest pain but starting to experience headache from nitroglycerin.  Administered migraine cocktail.    Second troponin returned at 790.  CT without acute findings.    Patient admitted to cardiology for NSTEMI.  Please see inpatient notes for further details.    Disposition:  I discussed the case with cardiology who graciously agreed to admit the patient to their service for continued care.   Clinical Impression: No diagnosis found.  Rx / DC Orders ED Discharge Orders     None       The plan for this patient was discussed with Dr. Anitra Lauth, who voiced agreement and who oversaw evaluation and treatment of this patient.   Clinical Complexity A medically appropriate history, review of systems, and physical exam was performed.  My independent interpretations of EKG, labs, and radiology are documented in the ED course above.   Click here for ABCD2, HEART and other calculatorsREFRESH Note before signing   Patient's presentation is most consistent with acute presentation with potential threat to life or bodily function.  Medical Decision Making Amount and/or Complexity of Data Reviewed Labs: ordered. Radiology: ordered.  Risk OTC drugs. Prescription drug management. Decision regarding hospitalization.    HPI/ROS      See MDM section for pertinent HPI and ROS. A complete ROS was performed with pertinent positives/negatives noted above.   Past Medical History:  Diagnosis Date   Hypertension     History reviewed. No pertinent surgical history.    Physical Exam   Vitals:   08/03/23 0130 08/03/23 0200 08/03/23 0230 08/03/23 0300  BP:  138/76 133/75 128/80 117/71  Pulse: 90 84 85 86  Resp: 13 14 15 13   Temp:      TempSrc:      SpO2: 95% 95% 95% 96%  Weight:      Height:        Physical Exam Vitals and nursing note reviewed.   Constitutional:      General: He is not in acute distress.    Appearance: He is well-developed.  HENT:     Head: Normocephalic and atraumatic.  Eyes:     Conjunctiva/sclera: Conjunctivae normal.  Cardiovascular:     Rate and Rhythm: Normal rate and regular rhythm.     Heart sounds: No murmur heard. Pulmonary:     Effort: Pulmonary effort is normal. No respiratory distress.     Breath sounds: Normal breath sounds.  Chest:     Chest wall: No tenderness.  Abdominal:     Palpations: Abdomen is soft.     Tenderness: There is no abdominal tenderness.  Musculoskeletal:        General: No swelling.     Cervical back: Neck supple.  Skin:    General: Skin is warm and dry.     Capillary Refill: Capillary refill takes less than 2 seconds.  Neurological:     Mental Status: He is alert.  Psychiatric:        Mood and Affect: Mood normal.     [name], MD Department of Emergency Medicine   Please note that this documentation was produced with the assistance of voice-to-text technology and may contain errors.    [name], MD 08/03/23 4098    [name], MD 08/03/23 2255

## 2023-08-03 ENCOUNTER — Encounter (HOSPITAL_COMMUNITY)
Admission: EM | Disposition: A | Discharge status: Home / Self Care | Payer: Self-pay | Source: Ambulatory Visit | Attending: Internal Medicine

## 2023-08-03 ENCOUNTER — Other Ambulatory Visit (HOSPITAL_COMMUNITY): Payer: Self-pay

## 2023-08-03 ENCOUNTER — Inpatient Hospital Stay (HOSPITAL_COMMUNITY): Payer: Medicaid Other

## 2023-08-03 DIAGNOSIS — I214 Non-ST elevation (NSTEMI) myocardial infarction: Secondary | ICD-10-CM

## 2023-08-03 DIAGNOSIS — I2510 Atherosclerotic heart disease of native coronary artery without angina pectoris: Secondary | ICD-10-CM | POA: Diagnosis not present

## 2023-08-03 HISTORY — PX: CORONARY STENT INTERVENTION: CATH118234

## 2023-08-03 HISTORY — PX: LEFT HEART CATH AND CORONARY ANGIOGRAPHY: CATH118249

## 2023-08-03 LAB — BASIC METABOLIC PANEL
Anion gap: 10 (ref 5–15)
BUN: 9 mg/dL (ref 6–20)
CO2: 24 mmol/L (ref 22–32)
Calcium: 8.1 mg/dL — ABNORMAL LOW (ref 8.9–10.3)
Chloride: 103 mmol/L (ref 98–111)
Creatinine, Ser: 1.24 mg/dL (ref 0.61–1.24)
GFR, Estimated: >60 mL/min (ref >60)
Glucose, Bld: 109 mg/dL — ABNORMAL HIGH (ref 70–99)
Potassium: 3.5 mmol/L (ref 3.5–5.1)
Sodium: 137 mmol/L (ref 135–145)

## 2023-08-03 LAB — HIV ANTIBODY (ROUTINE TESTING W REFLEX): HIV Screen 4th Generation wRfx: NONREACTIVE

## 2023-08-03 LAB — ECHOCARDIOGRAM COMPLETE
Area-P 1/2: 3.43 cm2
Height: 68 in
S' Lateral: 3.25 cm
Weight: 2895.96 [oz_av]

## 2023-08-03 LAB — HEPARIN LEVEL (UNFRACTIONATED)
Heparin Unfractionated: 0.21 [IU]/mL — ABNORMAL LOW (ref 0.30–0.70)
Heparin Unfractionated: <0.1 [IU]/mL — ABNORMAL LOW (ref 0.30–0.70)

## 2023-08-03 LAB — POCT ACTIVATED CLOTTING TIME
Activated Clotting Time: 226 seconds
Activated Clotting Time: 250 seconds

## 2023-08-03 LAB — CBC
HCT: 39.7 % (ref 39.0–52.0)
Hemoglobin: 13.5 g/dL (ref 13.0–17.0)
MCH: 32.7 pg (ref 26.0–34.0)
MCHC: 34 g/dL (ref 30.0–36.0)
MCV: 96.1 fL (ref 80.0–100.0)
Platelets: 248 10*3/uL (ref 150–400)
RBC: 4.13 MIL/uL — ABNORMAL LOW (ref 4.22–5.81)
RDW: 14.1 % (ref 11.5–15.5)
WBC: 10.3 10*3/uL (ref 4.0–10.5)
nRBC: 0 % (ref 0.0–0.2)

## 2023-08-03 LAB — LIPID PANEL
Cholesterol: 133 mg/dL (ref 0–200)
HDL: 46 mg/dL (ref >40)
LDL Cholesterol: 70 mg/dL (ref 0–99)
Total CHOL/HDL Ratio: 2.9 ratio
Triglycerides: 85 mg/dL (ref <150)
VLDL: 17 mg/dL (ref 0–40)

## 2023-08-03 LAB — MAGNESIUM
Magnesium: 1.6 mg/dL — ABNORMAL LOW (ref 1.7–2.4)
Magnesium: 2 mg/dL (ref 1.7–2.4)

## 2023-08-03 LAB — TYPE AND SCREEN
ABO/RH(D): B POS
Antibody Screen: NEGATIVE

## 2023-08-03 LAB — BRAIN NATRIURETIC PEPTIDE: B Natriuretic Peptide: 84.4 pg/mL (ref 0.0–100.0)

## 2023-08-03 LAB — MRSA NEXT GEN BY PCR, NASAL: MRSA by PCR Next Gen: NOT DETECTED

## 2023-08-03 LAB — HEMOGLOBIN A1C
Hgb A1c MFr Bld: 5.9 % — ABNORMAL HIGH (ref 4.8–5.6)
Mean Plasma Glucose: 122.63 mg/dL

## 2023-08-03 LAB — PROTIME-INR
INR: 1 (ref 0.8–1.2)
Prothrombin Time: 13.2 s (ref 11.4–15.2)

## 2023-08-03 LAB — APTT: aPTT: 38 s — ABNORMAL HIGH (ref 24–36)

## 2023-08-03 LAB — TSH: TSH: 1.161 u[IU]/mL (ref 0.350–4.500)

## 2023-08-03 SURGERY — LEFT HEART CATH AND CORONARY ANGIOGRAPHY
Anesthesia: LOCAL

## 2023-08-03 MED ORDER — FENTANYL CITRATE (PF) 100 MCG/2ML IJ SOLN
INTRAMUSCULAR | Status: DC | PRN
  Administered 2023-08-03: 25 ug via INTRAVENOUS

## 2023-08-03 MED ORDER — HEPARIN (PORCINE) IN NACL 1000-0.9 UT/500ML-% IV SOLN
INTRAVENOUS | Status: DC | PRN
  Administered 2023-08-03 (×2): 500 mL

## 2023-08-03 MED ORDER — SODIUM CHLORIDE 0.9 % WEIGHT BASED INFUSION
1.0000 mL/kg/h | INTRAVENOUS | Status: AC
  Administered 2023-08-03: 1 mL/kg/h via INTRAVENOUS

## 2023-08-03 MED ORDER — HEPARIN BOLUS VIA INFUSION
2400.0000 [IU] | Freq: Once | INTRAVENOUS | Status: AC
  Administered 2023-08-03: 2400 [IU] via INTRAVENOUS
  Filled 2023-08-03: qty 2400

## 2023-08-03 MED ORDER — ATORVASTATIN CALCIUM 80 MG PO TABS
80.0000 mg | ORAL_TABLET | Freq: Every day | ORAL | Status: DC
  Administered 2023-08-04: 80 mg via ORAL
  Filled 2023-08-03: qty 1

## 2023-08-03 MED ORDER — LIDOCAINE HCL (PF) 1 % IJ SOLN
INTRAMUSCULAR | Status: AC
  Filled 2023-08-03: qty 30

## 2023-08-03 MED ORDER — MAGNESIUM SULFATE 2 GM/50ML IV SOLN
2.0000 g | Freq: Once | INTRAVENOUS | Status: AC
  Administered 2023-08-03: 2 g via INTRAVENOUS
  Filled 2023-08-03: qty 50

## 2023-08-03 MED ORDER — HEPARIN SODIUM (PORCINE) 1000 UNIT/ML IJ SOLN
INTRAMUSCULAR | Status: DC | PRN
  Administered 2023-08-03: 3000 [IU] via INTRAVENOUS
  Administered 2023-08-03 (×3): 4000 [IU] via INTRAVENOUS

## 2023-08-03 MED ORDER — TICAGRELOR 90 MG PO TABS
ORAL_TABLET | ORAL | Status: DC | PRN
  Administered 2023-08-03: 180 mg via ORAL

## 2023-08-03 MED ORDER — SODIUM CHLORIDE 0.9% FLUSH
3.0000 mL | Freq: Two times a day (BID) | INTRAVENOUS | Status: DC
  Administered 2023-08-03 – 2023-08-04 (×2): 3 mL via INTRAVENOUS

## 2023-08-03 MED ORDER — TICAGRELOR 90 MG PO TABS
90.0000 mg | ORAL_TABLET | Freq: Two times a day (BID) | ORAL | Status: DC
  Administered 2023-08-03 – 2023-08-04 (×2): 90 mg via ORAL
  Filled 2023-08-03 (×2): qty 1

## 2023-08-03 MED ORDER — SODIUM CHLORIDE 0.9 % WEIGHT BASED INFUSION
1.0000 mL/kg/h | INTRAVENOUS | Status: DC
  Administered 2023-08-03: 1 mL/kg/h via INTRAVENOUS

## 2023-08-03 MED ORDER — SODIUM CHLORIDE 0.9 % WEIGHT BASED INFUSION
3.0000 mL/kg/h | INTRAVENOUS | Status: DC
  Administered 2023-08-03: 3 mL/kg/h via INTRAVENOUS

## 2023-08-03 MED ORDER — METOPROLOL TARTRATE 50 MG PO TABS
50.0000 mg | ORAL_TABLET | Freq: Two times a day (BID) | ORAL | Status: DC
  Administered 2023-08-03 – 2023-08-04 (×3): 50 mg via ORAL
  Filled 2023-08-03 (×3): qty 1

## 2023-08-03 MED ORDER — LIDOCAINE HCL (PF) 1 % IJ SOLN
INTRAMUSCULAR | Status: DC | PRN
  Administered 2023-08-03: 5 mL via INTRADERMAL

## 2023-08-03 MED ORDER — LABETALOL HCL 5 MG/ML IV SOLN
10.0000 mg | INTRAVENOUS | Status: AC | PRN
  Administered 2023-08-03 (×2): 10 mg via INTRAVENOUS
  Filled 2023-08-03 (×2): qty 4

## 2023-08-03 MED ORDER — IOHEXOL 350 MG/ML SOLN
INTRAVENOUS | Status: DC | PRN
  Administered 2023-08-03: 95 mL

## 2023-08-03 MED ORDER — MORPHINE SULFATE (PF) 2 MG/ML IV SOLN
2.0000 mg | INTRAVENOUS | Status: DC | PRN
  Administered 2023-08-03 – 2023-08-04 (×6): 2 mg via INTRAVENOUS
  Filled 2023-08-03 (×6): qty 1

## 2023-08-03 MED ORDER — TICAGRELOR 90 MG PO TABS
ORAL_TABLET | ORAL | Status: AC
  Filled 2023-08-03: qty 2

## 2023-08-03 MED ORDER — FENTANYL CITRATE (PF) 100 MCG/2ML IJ SOLN
INTRAMUSCULAR | Status: AC
  Filled 2023-08-03: qty 2

## 2023-08-03 MED ORDER — ACETAMINOPHEN 500 MG PO TABS
1000.0000 mg | ORAL_TABLET | Freq: Once | ORAL | Status: AC
  Administered 2023-08-03: 1000 mg via ORAL

## 2023-08-03 MED ORDER — HYDRALAZINE HCL 20 MG/ML IJ SOLN: 10.0000 mg | INTRAMUSCULAR | Status: AC | PRN

## 2023-08-03 MED ORDER — AMLODIPINE BESYLATE 5 MG PO TABS
5.0000 mg | ORAL_TABLET | Freq: Every day | ORAL | Status: DC
  Administered 2023-08-03: 5 mg via ORAL
  Filled 2023-08-03: qty 1

## 2023-08-03 MED ORDER — HEPARIN SODIUM (PORCINE) 1000 UNIT/ML IJ SOLN
INTRAMUSCULAR | Status: AC
  Filled 2023-08-03: qty 10

## 2023-08-03 MED ORDER — ACETAMINOPHEN 500 MG PO TABS
ORAL_TABLET | ORAL | Status: AC
  Filled 2023-08-03: qty 2

## 2023-08-03 MED ORDER — ATORVASTATIN CALCIUM 40 MG PO TABS
40.0000 mg | ORAL_TABLET | Freq: Every day | ORAL | Status: DC
  Administered 2023-08-03: 40 mg via ORAL
  Filled 2023-08-03: qty 1

## 2023-08-03 MED ORDER — LOSARTAN POTASSIUM 50 MG PO TABS
100.0000 mg | ORAL_TABLET | Freq: Every day | ORAL | Status: DC
  Administered 2023-08-03 – 2023-08-04 (×2): 100 mg via ORAL
  Filled 2023-08-03 (×3): qty 2

## 2023-08-03 MED ORDER — SODIUM CHLORIDE 0.9% FLUSH: 3.0000 mL | INTRAVENOUS | Status: DC | PRN

## 2023-08-03 MED ORDER — ENOXAPARIN SODIUM 40 MG/0.4ML IJ SOSY
40.0000 mg | PREFILLED_SYRINGE | INTRAMUSCULAR | Status: DC
  Administered 2023-08-04: 40 mg via SUBCUTANEOUS
  Filled 2023-08-03: qty 0.4

## 2023-08-03 MED ORDER — MIDAZOLAM HCL 2 MG/2ML IJ SOLN
INTRAMUSCULAR | Status: DC | PRN
  Administered 2023-08-03: 2 mg via INTRAVENOUS

## 2023-08-03 MED ORDER — POTASSIUM CHLORIDE CRYS ER 20 MEQ PO TBCR
40.0000 meq | EXTENDED_RELEASE_TABLET | Freq: Once | ORAL | Status: AC
  Administered 2023-08-03: 40 meq via ORAL
  Filled 2023-08-03: qty 2

## 2023-08-03 MED ORDER — VERAPAMIL HCL 2.5 MG/ML IV SOLN
INTRAVENOUS | Status: AC
  Filled 2023-08-03: qty 4

## 2023-08-03 MED ORDER — VERAPAMIL HCL 2.5 MG/ML IV SOLN
INTRAVENOUS | Status: DC | PRN
  Administered 2023-08-03: 10 mL via INTRA_ARTERIAL

## 2023-08-03 MED ORDER — SODIUM CHLORIDE 0.9 % IV SOLN: 250.0000 mL | INTRAVENOUS | Status: DC | PRN

## 2023-08-03 MED ORDER — METOPROLOL TARTRATE 50 MG PO TABS: 50.0000 mg | ORAL_TABLET | Freq: Every day | ORAL | Status: DC

## 2023-08-03 MED ORDER — MIDAZOLAM HCL 2 MG/2ML IJ SOLN
INTRAMUSCULAR | Status: AC
  Filled 2023-08-03: qty 2

## 2023-08-03 SURGICAL SUPPLY — 18 items
BALL SAPPHIRE NC24 3.75X18 (BALLOONS) ×1
BALLN EMERGE MR 2.5X12 (BALLOONS) ×1
BALLOON EMERGE MR 2.5X12 (BALLOONS) IMPLANT
BALLOON SAPPHIRE NC24 3.75X18 (BALLOONS) IMPLANT
CATH 5FR JL3.5 JR4 ANG PIG MP (CATHETERS) IMPLANT
CATH VISTA GUIDE 6FR XBLAD3.5 (CATHETERS) IMPLANT
DEVICE RAD COMP TR BAND LRG (VASCULAR PRODUCTS) IMPLANT
GLIDESHEATH SLEND SS 6F .021 (SHEATH) IMPLANT
KIT ENCORE 26 ADVANTAGE (KITS) IMPLANT
PACK CARDIAC CATHETERIZATION (CUSTOM PROCEDURE TRAY) ×2 IMPLANT
PROTECTION STATION PRESSURIZED (MISCELLANEOUS) ×1
SET ATX-X65L (MISCELLANEOUS) IMPLANT
STATION PROTECTION PRESSURIZED (MISCELLANEOUS) IMPLANT
STENT SYNERGY XD 3.50X24 (Permanent Stent) IMPLANT
SYNERGY XD 3.50X24 (Permanent Stent) ×1 IMPLANT
TUBING CIL FLEX 10 FLL-RA (TUBING) IMPLANT
WIRE ASAHI PROWATER 180CM (WIRE) IMPLANT
WIRE EMERALD 3MM-J .035X260CM (WIRE) IMPLANT

## 2023-08-03 NOTE — Progress Notes (Signed)
Verbal order given by Eartha Inch, PA to titrate nitroglycerin drip to 20 mcg/min. Order read back and verified.

## 2023-08-03 NOTE — Progress Notes (Signed)
ANTICOAGULATION CONSULT NOTE - Follow Up  Pharmacy Consult for heparin Indication: chest pain/ACS  No Known Allergies  Patient Measurements: Height: 5\' 8"  (172.7 cm) Weight: 82.1 kg (181 lb) IBW/kg (Calculated) : 68.4 Heparin Dosing Weight: 82 kg   Vital Signs: Temp: 98.6 F (37 C) (08/16 0719) Temp Source: Oral (08/16 0719) BP: 142/92 (08/16 0902) Pulse Rate: 101 (08/16 0902)  Labs: Recent Labs    08/02/23 1627 08/02/23 1935 08/02/23 2353 08/03/23 0701 08/03/23 0858  HGB 15.0  --   --  13.5  --   HCT 44.9  --   --  39.7  --   PLT 271  --   --  248  --   APTT  --   --  38*  --   --   LABPROT  --   --  13.2  --   --   INR  --   --  1.0  --   --   HEPARINUNFRC  --   --  <0.10*  --  0.21*  CREATININE 1.35*  --   --  1.24  --   TROPONINIHS 646* 790*  --   --   --     Estimated Creatinine Clearance: 80.3 mL/min (by C-G formula based on SCr of 1.24 mg/dL).   Medical History: Past Medical History:  Diagnosis Date   Hypertension     Medications:  Scheduled:   acetaminophen       aspirin EC  81 mg Oral Daily   atorvastatin  40 mg Oral Daily   losartan  100 mg Oral Daily   metoprolol tartrate  50 mg Oral BID   Infusions:   sodium chloride 3 mL/kg/hr (08/03/23 1610)   Followed by   sodium chloride     heparin 1,300 Units/hr (08/03/23 0320)   magnesium sulfate bolus IVPB 2 g (08/03/23 0940)   nitroGLYCERIN 66.667 mcg/min (08/03/23 9604)    Assessment: 42 yo male presenting with radiating chest pain/NSTEMI. No anticoagulation prior to admission. Pharmacy consulted for heparin.    Heparin level 0.21 is subtherapeutic after rebolus and on 1300 units/hr. CBC stable.  No issues with infusion or bleeding per RN. Planning LHC 8/16.   Goal of Therapy:  Heparin level 0.3-0.7 units/ml Monitor platelets by anticoagulation protocol: Yes   Plan:  Increase heparin to 1450 units/hr  Monitor daily heparin level, CBC, signs/symptoms of bleeding  F/u post cath - order  8hr heparin level if contiuing  Alphia Moh, PharmD, BCPS, [facility] Clinical Pharmacist  Please check AMION for all [facility] Pharmacy phone numbers After 10:00 PM, call Main Pharmacy [phone]

## 2023-08-03 NOTE — Interval H&P Note (Signed)
History and Physical Interval Note:  08/03/2023 10:56 AM  Glenn Weber  has presented today for surgery, with the diagnosis of nstemi.  The various methods of treatment have been discussed with the patient and family. After consideration of risks, benefits and other options for treatment, the patient has consented to  Procedure(s): LEFT HEART CATH AND CORONARY ANGIOGRAPHY (N/A) as a surgical intervention.  The patient's history has been reviewed, patient examined, no change in status, stable for surgery.  I have reviewed the patient's chart and labs.  Questions were answered to the patient's satisfaction.     Cath Lab Visit (complete for each Cath Lab visit)  Clinical Evaluation Leading to the Procedure:   ACS: Yes.    Non-ACS:    Anginal Classification: CCS III  Anti-ischemic medical therapy: Minimal Therapy (1 class of medications)  Non-Invasive Test Results: No non-invasive testing performed  Prior CABG: No previous CABG       Theron Arista [facility] 08/03/2023 10:56 AM

## 2023-08-03 NOTE — Progress Notes (Signed)
Called to room 2C10 to assess TR band and radial access site for bleeding. Active bleeding noted. TR band repositioned and 12cc inserted. No bleeding or hematoma noted post repositioning. Post activity and precautions explained to patient and family. Patient verbalized understanding. Care released to RN.Mamie Levers

## 2023-08-03 NOTE — TOC Benefit Eligibility Note (Signed)
Pharmacy Patient Advocate Encounter  Insurance verification completed.    The patient is insured through [facility] MEDICAID.     Ran test claim for Brilinta and the current 30 day co-pay is $4.00.   This test claim was processed through [facility]- copay amounts may vary at other pharmacies due to pharmacy/plan contracts, or as the patient moves through the different stages of their insurance plan.

## 2023-08-03 NOTE — Progress Notes (Signed)
ANTICOAGULATION CONSULT NOTE - Follow Up  Pharmacy Consult for heparin Indication: chest pain/ACS  No Known Allergies  Patient Measurements: Height: 5\' 8"  (172.7 cm) Weight: 81.2 kg (179 lb 0.2 oz) IBW/kg (Calculated) : 68.4 Heparin Dosing Weight: 82.6 kg   Vital Signs: Temp: 97.7 F (36.5 C) (08/15 2251) Temp Source: Oral (08/15 2251) BP: 138/76 (08/16 0130) Pulse Rate: 90 (08/16 0130)  Labs: Recent Labs    08/02/23 1627 08/02/23 1935 08/02/23 2353  HGB 15.0  --   --   HCT 44.9  --   --   PLT 271  --   --   APTT  --   --  38*  LABPROT  --   --  13.2  INR  --   --  1.0  HEPARINUNFRC  --   --  <0.10*  CREATININE 1.35*  --   --   TROPONINIHS 646* 790*  --     Estimated Creatinine Clearance: 68.3 mL/min (A) (by C-G formula based on SCr of 1.35 mg/dL (H)).   Medical History: Past Medical History:  Diagnosis Date   Hypertension     Medications:  Scheduled:   aspirin EC  81 mg Oral Daily   atorvastatin  40 mg Oral Daily   losartan  100 mg Oral Daily   metoprolol tartrate  50 mg Oral Daily   Infusions:   heparin 1,000 Units/hr (08/02/23 1822)   nitroGLYCERIN 100 mcg/min (08/03/23 0039)    Assessment: 42 yo male presenting with radiating chest pain. Pharmacy consulted to dose heparin. CBC stable and no s/sx of bleeding noted   8/16 AM: heparin level returned at <0.1 (sub therapeutic) on 1000 units/hr. Per RN no issues with the heparin running continuously and drawn appropriately in opposite arm. Previous CBC stable.  Goal of Therapy:  Heparin level 0.3-0.7 units/ml Monitor platelets by anticoagulation protocol: Yes   Plan:  Bolus 2400 units Then increase heparin to 1300 units/hr  6 hour heparin level  Monitor daily heparin level and CBC  Monitor for s/sx of bleeding   Arabella Merles, PharmD. Clinical Pharmacist 08/03/2023 2:08 AM

## 2023-08-03 NOTE — Progress Notes (Signed)
   Patient Name: Glenn Weber Date of Encounter: 08/03/2023 [facility] Cardiologist: None   Interval Summary  .    Patient's main complaint this AM is a headache. Did not improve with tylenol. He has very mild chest discomfort and is currently no IV nitroglycerin and IV heparin. On for cath this AM    Vital Signs .    Vitals:   08/03/23 0430 08/03/23 0500 08/03/23 0600 08/03/23 0719  BP: 126/84 (!) 123/93 127/86 (!) 144/84  Pulse: 89 86 90 100  Resp: 15 14 14  (!) 23  Temp: 98.5 F (36.9 C)   98.6 F (37 C)  TempSrc: Oral   Oral  SpO2: 94% 98%  97%  Weight:      Height:        Intake/Output Summary (Last 24 hours) at 08/03/2023 0751 Last data filed at 08/03/2023 0452 Gross per 24 hour  Intake 524.47 ml  Output 750 ml  Net -225.53 ml      08/02/2023   10:51 PM 08/02/2023    4:20 PM 05/29/2022   12:53 PM  Last 3 Weights  Weight (lbs) 179 lb 0.2 oz 182 lb 178 lb  Weight (kg) 81.2 kg 82.555 kg 80.74 kg      Telemetry/ECG    Telemetry shows NSR with HR in the 80s-90s - Personally Reviewed  Physical Exam .   GEN: No acute distress.  Laying flat in the bed. Asleep, but arouses easily to voice  Neck: No JVD Cardiac: RRR, no murmurs, rubs, or gallops. Radial pulses 2_ bilaterally   Respiratory: Clear to auscultation bilaterally. Normal work of breathing on room air  GI: Soft, nontender, non-distended  MS: No edema in BLE   Assessment & Plan .    NSTEMI  - Patient presented complaining of chest pain  - hsTn 646, 790  - EKG showed ST depression and T wave inversions in the inferolateral leads  - Patient is on IV heparin and IV nitroglycerin  - He does have headache on IV nitroglycerin that did not resolve with tylenol. Offered stopping the nitroglycerin, but patient is concerned he will develop chest pain again  - Instructed RN to decrease IV nitroglycerin and ordered PRN morphine  - On for LHC this AM  - Continue ASA 81 mg daily  - Started lipitor 40 mg  daily - new this admission. LDL 70  - Continue metoprolol tartrate 50 mg daily   HTN  - Continue home losartan 100 mg daily and metoprolol tartrate 50 mg BID   Occasional Tobacco use  - Patient has been counseled on tobacco cessation   Informed Consent   Shared Decision Making/Informed Consent The risks [stroke (1 in 1000), death (1 in 1000), kidney failure [usually temporary] (1 in 500), bleeding (1 in 200), allergic reaction [possibly serious] (1 in 200)], benefits (diagnostic support and management of coronary artery disease) and alternatives of a cardiac catheterization were discussed in detail with Glenn Weber and he is willing to proceed.      For questions or updates, please contact [facility] Please consult [email or web address] for contact info under        Signed, Jonita Albee, PA-C

## 2023-08-03 NOTE — Progress Notes (Signed)
TR band removed, no complications at site, gauze and tegaderm dressing applied.

## 2023-08-03 NOTE — Plan of Care (Signed)

## 2023-08-03 NOTE — Progress Notes (Signed)
Echocardiogram 2D Echocardiogram has been performed.  Warren Lacy Raman Featherston RDCS 08/03/2023, 10:49 AM

## 2023-08-03 NOTE — H&P (View-Only) (Signed)
   Patient Name: Glenn Weber Date of Encounter: 08/03/2023 [facility] Cardiologist: None   Interval Summary  .    Patient's main complaint this AM is a headache. Did not improve with tylenol. He has very mild chest discomfort and is currently no IV nitroglycerin and IV heparin. On for cath this AM    Vital Signs .    Vitals:   08/03/23 0430 08/03/23 0500 08/03/23 0600 08/03/23 0719  BP: 126/84 (!) 123/93 127/86 (!) 144/84  Pulse: 89 86 90 100  Resp: 15 14 14  (!) 23  Temp: 98.5 F (36.9 C)   98.6 F (37 C)  TempSrc: Oral   Oral  SpO2: 94% 98%  97%  Weight:      Height:        Intake/Output Summary (Last 24 hours) at 08/03/2023 0751 Last data filed at 08/03/2023 0452 Gross per 24 hour  Intake 524.47 ml  Output 750 ml  Net -225.53 ml      08/02/2023   10:51 PM 08/02/2023    4:20 PM 05/29/2022   12:53 PM  Last 3 Weights  Weight (lbs) 179 lb 0.2 oz 182 lb 178 lb  Weight (kg) 81.2 kg 82.555 kg 80.74 kg      Telemetry/ECG    Telemetry shows NSR with HR in the 80s-90s - Personally Reviewed  Physical Exam .   GEN: No acute distress.  Laying flat in the bed. Asleep, but arouses easily to voice  Neck: No JVD Cardiac: RRR, no murmurs, rubs, or gallops. Radial pulses 2_ bilaterally   Respiratory: Clear to auscultation bilaterally. Normal work of breathing on room air  GI: Soft, nontender, non-distended  MS: No edema in BLE   Assessment & Plan .    NSTEMI  - Patient presented complaining of chest pain  - hsTn 646, 790  - EKG showed ST depression and T wave inversions in the inferolateral leads  - Patient is on IV heparin and IV nitroglycerin  - He does have headache on IV nitroglycerin that did not resolve with tylenol. Offered stopping the nitroglycerin, but patient is concerned he will develop chest pain again  - Instructed RN to decrease IV nitroglycerin and ordered PRN morphine  - On for LHC this AM  - Continue ASA 81 mg daily  - Started lipitor 40 mg  daily - new this admission. LDL 70  - Continue metoprolol tartrate 50 mg daily   HTN  - Continue home losartan 100 mg daily and metoprolol tartrate 50 mg BID   Occasional Tobacco use  - Patient has been counseled on tobacco cessation   Informed Consent   Shared Decision Making/Informed Consent The risks [stroke (1 in 1000), death (1 in 1000), kidney failure [usually temporary] (1 in 500), bleeding (1 in 200), allergic reaction [possibly serious] (1 in 200)], benefits (diagnostic support and management of coronary artery disease) and alternatives of a cardiac catheterization were discussed in detail with Glenn Weber and he is willing to proceed.      For questions or updates, please contact [facility] Please consult [email or web address] for contact info under        Signed, Jonita Albee, PA-C

## 2023-08-03 NOTE — Plan of Care (Signed)
  Problem: Education: Goal: Understanding of cardiac disease, CV risk reduction, and recovery process will improve Outcome: Progressing Goal: Individualized Educational Video(s) Outcome: Not Applicable   Problem: Activity: Goal: Ability to tolerate increased activity will improve Outcome: Progressing   Problem: Cardiac: Goal: Ability to achieve and maintain adequate cardiovascular perfusion will improve Outcome: Progressing   Problem: Health Behavior/Discharge Planning: Goal: Ability to safely manage health-related needs after discharge will improve Outcome: Progressing   Problem: Education: Goal: Knowledge of General Education information will improve Description: Including pain rating scale, medication(s)/side effects and non-pharmacologic comfort measures Outcome: Progressing   Problem: Health Behavior/Discharge Planning: Goal: Ability to manage health-related needs will improve Outcome: Progressing   Problem: Clinical Measurements: Goal: Ability to maintain clinical measurements within normal limits will improve Outcome: Progressing Goal: Will remain free from infection Outcome: Progressing Goal: Diagnostic test results will improve Outcome: Progressing Goal: Respiratory complications will improve Outcome: Progressing Goal: Cardiovascular complication will be avoided Outcome: Progressing   Problem: Activity: Goal: Risk for activity intolerance will decrease Outcome: Progressing   Problem: Nutrition: Goal: Adequate nutrition will be maintained Outcome: Progressing   Problem: Coping: Goal: Level of anxiety will decrease Outcome: Progressing   Problem: Elimination: Goal: Will not experience complications related to bowel motility Outcome: Progressing Goal: Will not experience complications related to urinary retention Outcome: Progressing   Problem: Pain Managment: Goal: General experience of comfort will improve Outcome: Progressing   Problem:  Safety: Goal: Ability to remain free from injury will improve Outcome: Progressing   Problem: Skin Integrity: Goal: Risk for impaired skin integrity will decrease Outcome: Progressing   Problem: Education: Goal: Understanding of CV disease, CV risk reduction, and recovery process will improve Outcome: Progressing Goal: Individualized Educational Video(s) Outcome: Not Applicable   Problem: Activity: Goal: Ability to return to baseline activity level will improve Outcome: Progressing   Problem: Cardiovascular: Goal: Ability to achieve and maintain adequate cardiovascular perfusion will improve Outcome: Progressing Goal: Vascular access site(s) Level 0-1 will be maintained Outcome: Progressing   Problem: Health Behavior/Discharge Planning: Goal: Ability to safely manage health-related needs after discharge will improve Outcome: Progressing

## 2023-08-04 ENCOUNTER — Other Ambulatory Visit (HOSPITAL_COMMUNITY): Payer: Self-pay

## 2023-08-04 ENCOUNTER — Telehealth: Payer: Self-pay | Admitting: Cardiology

## 2023-08-04 DIAGNOSIS — I10 Essential (primary) hypertension: Secondary | ICD-10-CM | POA: Diagnosis not present

## 2023-08-04 DIAGNOSIS — I214 Non-ST elevation (NSTEMI) myocardial infarction: Secondary | ICD-10-CM | POA: Diagnosis not present

## 2023-08-04 DIAGNOSIS — Z720 Tobacco use: Secondary | ICD-10-CM | POA: Insufficient documentation

## 2023-08-04 LAB — BASIC METABOLIC PANEL
Anion gap: 11 (ref 5–15)
BUN: 7 mg/dL (ref 6–20)
CO2: 25 mmol/L (ref 22–32)
Calcium: 8.5 mg/dL — ABNORMAL LOW (ref 8.9–10.3)
Chloride: 101 mmol/L (ref 98–111)
Creatinine, Ser: 1.21 mg/dL (ref 0.61–1.24)
GFR, Estimated: >60 mL/min (ref >60)
Glucose, Bld: 136 mg/dL — ABNORMAL HIGH (ref 70–99)
Potassium: 3.5 mmol/L (ref 3.5–5.1)
Sodium: 137 mmol/L (ref 135–145)

## 2023-08-04 LAB — CBC
HCT: 42.2 % (ref 39.0–52.0)
Hemoglobin: 14.3 g/dL (ref 13.0–17.0)
MCH: 32 pg (ref 26.0–34.0)
MCHC: 33.9 g/dL (ref 30.0–36.0)
MCV: 94.4 fL (ref 80.0–100.0)
Platelets: 240 10*3/uL (ref 150–400)
RBC: 4.47 MIL/uL (ref 4.22–5.81)
RDW: 14.1 % (ref 11.5–15.5)
WBC: 8.4 10*3/uL (ref 4.0–10.5)
nRBC: 0 % (ref 0.0–0.2)

## 2023-08-04 MED ORDER — METOPROLOL TARTRATE 50 MG PO TABS
50.0000 mg | ORAL_TABLET | Freq: Two times a day (BID) | ORAL | 3 refills | Status: DC
  Filled 2023-08-04: qty 60, 30d supply, fill #0
  Filled 2023-08-04: qty 180, 90d supply, fill #0

## 2023-08-04 MED ORDER — TICAGRELOR 90 MG PO TABS
90.0000 mg | ORAL_TABLET | Freq: Two times a day (BID) | ORAL | 11 refills | Status: DC
Start: 2023-08-04 — End: 2023-09-21
  Filled 2023-08-04: qty 60, 30d supply, fill #0

## 2023-08-04 MED ORDER — ATORVASTATIN CALCIUM 80 MG PO TABS
80.0000 mg | ORAL_TABLET | Freq: Every day | ORAL | 3 refills | Status: DC
  Filled 2023-08-04: qty 90, 90d supply, fill #0

## 2023-08-04 MED ORDER — NITROGLYCERIN 0.4 MG SL SUBL
0.4000 mg | SUBLINGUAL_TABLET | SUBLINGUAL | 3 refills | Status: DC | PRN
  Filled 2023-08-04: qty 25, 30d supply, fill #0

## 2023-08-04 MED ORDER — ASPIRIN 81 MG PO TBEC
81.0000 mg | DELAYED_RELEASE_TABLET | Freq: Every day | ORAL | 3 refills | Status: AC
  Filled 2023-08-04: qty 120, 120d supply, fill #0

## 2023-08-04 MED ORDER — AMLODIPINE BESYLATE 10 MG PO TABS
10.0000 mg | ORAL_TABLET | Freq: Every day | ORAL | Status: DC
  Administered 2023-08-04: 10 mg via ORAL
  Filled 2023-08-04: qty 1

## 2023-08-04 MED ORDER — AMLODIPINE BESYLATE 10 MG PO TABS
10.0000 mg | ORAL_TABLET | Freq: Every day | ORAL | 3 refills | Status: DC
  Filled 2023-08-04: qty 90, 90d supply, fill #0

## 2023-08-04 NOTE — Progress Notes (Signed)
CARDIAC REHAB PHASE I   PRE:  Rate/Rhythm: 82 NSR  BP:  Supine: 132/83     SaO2: 96 RA  MODE:  Ambulation: 1500 ft   POST:  Rate/Rhythm: 94 NSR  BP:  Supine: 161/101     Recheck after 10 mins rest: 158/105      SaO2: 96 RA  Spoke with nurse and discontinued 2L oxygen, SaO2 stable without oxygen at rest and during walk. Pt tolerated exercise well and AMB 1500 ft with no assistive device, and standby assist. Pt had no rest break, chest pain, SOB or pain. Pt did endorse some mild dizziness at end of walk (no unsteady gait). Returned to room and post BP elevated, recheck 10 mins later BP still elevated. Nurse aware. Education given to pt and family on heart healthy diet, radial/femoral weight restrictions, MI booklet, adherence to NTG, Brilinta and ASA.  Home exercise guidelines given and will refer to cardiac rehab phase 2 in [place], patient is eager to attend if able. Pt left in the bed with call bell in reach. All questions were answered and pt verbalized understanding.  Harrie Jeans ACSM-CEP 08/04/2023 9:59 AM

## 2023-08-04 NOTE — Discharge Summary (Signed)
Discharge Summary    Patient ID: Glenn Weber MRN: [id number]; DOB: [birth year]  Admit date: 08/02/2023 Discharge date: 08/04/2023  PCP:  [facility]   [facility] Providers Cardiologist:  Maisie Fus, MD    Discharge Diagnoses    Principal Problem:   NSTEMI (non-ST elevated myocardial infarction) [facility]) Active Problems:   HTN (hypertension)   Tobacco use    Diagnostic Studies/Procedures    Echocardiogram 08/03/23 1. Left ventricular ejection fraction, by estimation, is 60 to 65%. The  left ventricle has normal function. The left ventricle has no regional  wall motion abnormalities. Left ventricular diastolic parameters were  normal.   2. Right ventricular systolic function is normal. The right ventricular  size is normal. There is normal pulmonary artery systolic pressure.   3. The mitral valve is normal in structure. Trivial mitral valve  regurgitation.   4. The aortic valve is tricuspid. Aortic valve regurgitation is not  visualized.   Left Heart Catheterization 08/03/23 Multivessel CAD. Culprit lesion is high grade proximal LCx. He also has moderate stenosis in the mid LAD, mid RCA and severe stenosis in the second diagonal Mildly elevated LVEDP Successful PCI of the proximal LCx with DES x 1.    Plan: DAPT for one year. Optimal medical therapy with high dose statin, BP control and smoking cessation. If patient has refractory angina despite optimal medical therapy then we could consider PCI of other lesions.   Diagnostic Dominance: Right  Intervention   _____________   History of Present Illness     Glenn Weber is a 42 y.o. male with a past medical history of hypertension and tobacco use.  He presented to the ED on 8/15 complaining of chest pain.  Patient reports that about a month ago he had a bronchitis, and he had cough and chest pain associated with this.  However, about 2 weeks ago he started to have sudden onset chest  tightness and pressure while at rest.  This pain was different than the pain he felt when he had bronchitis.  Patient described the chest pain as severe and reported that it radiated down his arm and up into his jaw.  Associated with indigestion.  Patient initially thought he was having indigestion and he expected his symptoms to subside on their own.  However, he continued to have symptoms over the next 2 weeks.  Chest pain worsened with exertion and he also had some nausea and shortness of breath.  Patient denied fever, chills, PND, orthopnea, swelling, palpitations, syncope, diaphoresis.  His family history is notable for a grandmother with CHF and several cousins who have had MIs.  Patient endorsed occasional tobacco use since he was 42 years old and infrequent alcohol use.  In the ED, vital signs showed that he was afebrile, HR 84 BPM, BP 160/110, RR 13 breaths per minute, oxygen saturation 99% on room air.  Labs notable for creatinine 1.35 and high-sensitivity troponin 646, 790.  Chest x-ray was without acute disease.  CTA chest was negative for dissection.  Patient was loaded with aspirin and was started on heparin and nitroglycerin in the ED.  Cardiology was consulted for admission  Hospital Course     Consultants:   Patient was admitted to the cardiology service on 8/15 for treatment of NSTEMI.  His EKG demonstrated ST segment depressions in leads II, III, aVF, V4-V6 with associated T wave inversions. He was loaded with Asprin and started on IV nitroglycerin and heparin, and he  was scheduled for cardiac catheterization.  He underwent echocardiogram on 8/16 that showed EF 60-65%, no regional wall motion abnormalities, normal RV function, no significant valvular disease.  He was taken to the Cath Lab with Dr. [place] on 8/16 and was found to have multivessel CAD.  His culprit lesion was a high-grade lesion in the proximal left circumflex.  He was also noted to have moderate stenosis in the mid LAD, mid  RCA, and severe stenosis in the second diagonal.  He was treated with DES x 1 in the proximal left circumflex.  Who was started on DAPT with recommendations to continue for 1 year uninterrupted.  He was also started on high-dose statin and counseled on tobacco cessation.  Of note, if patient has refractory angina despite optimal medical therapy, PCI of other lesions could be considered.  His TR band was removed the evening of 8/16 and there were no complications at site.  On the morning of 8/17, patient was seen and examined by Dr. Flora Lipps and was deemed stable for discharge.  NSTEMI  CAD  - Patient had presented to the ED with chest pain that had been intermittent for 2 weeks. EKG showed ST segment depressions in leads II, III, aVF, V4-V6 with associated TWIs. hsTn 646, 790.  - Echo showed EF 60-65% with no regional wall motion abnormalities  - LHC revealed multivessel disease with severe stenosis in prox left circumflex, moderate stenosis in the mid LAD and mid RCA, and severe stenosis in the second diagonal. He was treated with DESx1 to the proximal left circumflex. Remainder of disease will be managed medically  - Continue DAPT with ASA, Brilinta uninterrupted for 1 year  - Continue lipitor 80 mg daily - LDL 70 this admission. Needs LFTs and lipid panel in 8 weeks  - Continue metoprolol tartrate 50 mg BID and amlodipine 5 mg daily  - Patient has a follow up appointment on 8/28. I messaged the TOC pool to ensure patient gets a TOC phone call  - Right radial cath site stable prior to DC. Discussed activity restrictions and radial site care  - I provided a prescription of SL nitroglycerin- instructed patient that he cannot take SL nitroglycerin and cialis within 24 hours of each other as this can cause dangerous hypotension. Patient voiced understanding    HTN  - Continue metoprolol tartrate 50 mg BID, losartan 100 mg daily - Increased amlodipine to 10 mg daily  - Instructed patient to keep a BP  log until his follow up appointment and to bring in for review  - Discussed importance of low sodium diet and increasing physical activity as tolerated   Tobacco Use  - Patient counseled on tobacco cessation    Did the patient have an acute coronary syndrome (MI, NSTEMI, STEMI, etc) this admission?:  Yes                               AHA/ACC Clinical Performance & Quality Measures: Aspirin prescribed? - Yes ADP Receptor Inhibitor (Plavix/Clopidogrel, Brilinta/Ticagrelor or Effient/Prasugrel) prescribed (includes medically managed patients)? - Yes Beta Blocker prescribed? - Yes High Intensity Statin (Lipitor 40-80mg  or Crestor 20-40mg ) prescribed? - Yes EF assessed during THIS hospitalization? - Yes For EF <40%, was ACEI/ARB prescribed? - Not Applicable (EF >/= 40%) For EF <40%, Aldosterone Antagonist (Spironolactone or Eplerenone) prescribed? - Not Applicable (EF >/= 40%) Cardiac Rehab Phase II ordered (including medically managed patients)? - Yes  _____________  Discharge Vitals Blood pressure (!) 154/92, pulse 89, temperature 98 F (36.7 C), temperature source Oral, resp. rate 19, height 5\' 8"  (1.727 m), weight 82.1 kg, SpO2 94%.  Filed Weights   08/02/23 1620 08/02/23 2251 08/03/23 0902  Weight: 82.6 kg 81.2 kg 82.1 kg    Labs & Radiologic Studies    CBC Recent Labs    08/03/23 0701 08/04/23 0333  WBC 10.3 8.4  HGB 13.5 14.3  HCT 39.7 42.2  MCV 96.1 94.4  PLT 248 240   Basic Metabolic Panel Recent Labs    16/10/96 2353 08/03/23 0701 08/04/23 0333  NA  --  137 137  K  --  3.5 3.5  CL  --  103 101  CO2  --  24 25  GLUCOSE  --  109* 136*  BUN  --  9 7  CREATININE  --  1.24 1.21  CALCIUM  --  8.1* 8.5*  MG 2.0 1.6*  --    Liver Function Tests No results for input(s): "AST", "ALT", "ALKPHOS", "BILITOT", "PROT", "ALBUMIN" in the last 72 hours. No results for input(s): "LIPASE", "AMYLASE" in the last 72 hours. High Sensitivity Troponin:   Recent Labs   Lab 08/02/23 1627 08/02/23 1935  TROPONINIHS 646* 790*    BNP Invalid input(s): "POCBNP" D-Dimer No results for input(s): "DDIMER" in the last 72 hours. Hemoglobin A1C Recent Labs    08/02/23 2353  HGBA1C 5.9*   Fasting Lipid Panel Recent Labs    08/02/23 2353  CHOL 133  HDL 46  LDLCALC 70  TRIG 85  CHOLHDL 2.9   Thyroid Function Tests Recent Labs    08/02/23 2353  TSH 1.161   _____________  ECHOCARDIOGRAM COMPLETE  Result Date: 08/03/2023    ECHOCARDIOGRAM REPORT   Patient Name:   Glenn Weber Date of Exam: 08/03/2023 Medical Rec #:  [id number]      Height:       68.0 in Accession #:    [id number]     Weight:       181.0 lb Date of Birth:  10/14/1981     BSA:          1.959 m Patient Age:    41 years       BP:           142/92 mmHg Patient Gender: M              HR:           85 bpm. Exam Location:  Inpatient Procedure: 2D Echo, Color Doppler and Cardiac Doppler Indications:    NSTEMI i21.4  History:        Patient has no prior history of Echocardiogram examinations.                 Risk Factors:Hypertension.  Sonographer:    Irving Burton Senior RDCS Referring Phys: [id number] Karl Ito IMPRESSIONS  1. Left ventricular ejection fraction, by estimation, is 60 to 65%. The left ventricle has normal function. The left ventricle has no regional wall motion abnormalities. Left ventricular diastolic parameters were normal.  2. Right ventricular systolic function is normal. The right ventricular size is normal. There is normal pulmonary artery systolic pressure.  3. The mitral valve is normal in structure. Trivial mitral valve regurgitation.  4. The aortic valve is tricuspid. Aortic valve regurgitation is not visualized. FINDINGS  Left Ventricle: Left ventricular ejection fraction, by estimation, is 60 to 65%. The left ventricle has normal function. The  left ventricle has no regional wall motion abnormalities. The left ventricular internal cavity size was normal in size. There is  no left  ventricular hypertrophy. Left ventricular diastolic parameters were normal. Right Ventricle: The right ventricular size is normal. Right vetricular wall thickness was not assessed. Right ventricular systolic function is normal. There is normal pulmonary artery systolic pressure. The tricuspid regurgitant velocity is 2.18 m/s, and with an assumed right atrial pressure of 3 mmHg, the estimated right ventricular systolic pressure is 22.0 mmHg. Left Atrium: Left atrial size was normal in size. Right Atrium: Right atrial size was normal in size. Pericardium: There is no evidence of pericardial effusion. Mitral Valve: The mitral valve is normal in structure. Trivial mitral valve regurgitation. Tricuspid Valve: The tricuspid valve is normal in structure. Tricuspid valve regurgitation is mild. Aortic Valve: The aortic valve is tricuspid. Aortic valve regurgitation is not visualized. Pulmonic Valve: The pulmonic valve was normal in structure. Pulmonic valve regurgitation is not visualized. Aorta: The aortic root and ascending aorta are structurally normal, with no evidence of dilitation. IAS/Shunts: No atrial level shunt detected by color flow Doppler.  LEFT VENTRICLE PLAX 2D LVIDd:         5.10 cm   Diastology LVIDs:         3.25 cm   LV e' medial:    8.70 cm/s LV PW:         0.90 cm   LV E/e' medial:  8.2 LV IVS:        1.00 cm   LV e' lateral:   9.79 cm/s LVOT diam:     2.00 cm   LV E/e' lateral: 7.3 LV SV:         54 LV SV Index:   28 LVOT Area:     3.14 cm  RIGHT VENTRICLE RV S prime:     10.30 cm/s TAPSE (M-mode): 1.8 cm LEFT ATRIUM             Index        RIGHT ATRIUM           Index LA diam:        2.80 cm 1.43 cm/m   RA Area:     13.60 cm LA Vol (A2C):   38.0 ml 19.40 ml/m  RA Volume:   30.90 ml  15.77 ml/m LA Vol (A4C):   34.4 ml 17.56 ml/m LA Biplane Vol: 40.2 ml 20.52 ml/m  AORTIC VALVE LVOT Vmax:   95.30 cm/s LVOT Vmean:  67.500 cm/s LVOT VTI:    0.173 m  AORTA Ao Root diam: 3.50 cm Ao Asc diam:  3.40 cm  MITRAL VALVE               TRICUSPID VALVE MV Area (PHT): 3.43 cm    TR Peak grad:   19.0 mmHg MV Decel Time: 221 msec    TR Vmax:        218.00 cm/s MV E velocity: 71.10 cm/s MV A velocity: 81.70 cm/s  SHUNTS MV E/A ratio:  0.87        Systemic VTI:  0.17 m                            Systemic Diam: 2.00 cm Dietrich Pates MD Electronically signed by Dietrich Pates MD Signature Date/Time: 08/03/2023/1:08:45 PM    Final    CARDIAC CATHETERIZATION  Result Date: 08/03/2023 Multivessel CAD. Culprit lesion is high grade proximal  LCx. He also has moderate stenosis in the mid LAD, mid RCA and severe stenosis in the second diagonal Mildly elevated LVEDP Successful PCI of the proximal LCx with DES x 1. Plan: DAPT for one year. Optimal medical therapy with high dose statin, BP control and smoking cessation. If patient has refractory angina despite optimal medical therapy then we could consider PCI of other lesions.   CT Angio Chest Aorta W and/or Wo Contrast  Result Date: 08/02/2023 CLINICAL DATA:  Chest pain EXAM: CT ANGIOGRAPHY CHEST WITH CONTRAST TECHNIQUE: Multidetector CT imaging of the chest was performed using the standard protocol during bolus administration of intravenous contrast. Multiplanar CT image reconstructions and MIPs were obtained to evaluate the vascular anatomy. RADIATION DOSE REDUCTION: This exam was performed according to the departmental dose-optimization program which includes automated exposure control, adjustment of the mA and/or kV according to patient size and/or use of iterative reconstruction technique. CONTRAST:  75mL OMNIPAQUE IOHEXOL 350 MG/ML SOLN COMPARISON:  Chest x-ray 08/02/2023 FINDINGS: Cardiovascular: Satisfactory opacification of the pulmonary arteries to the segmental level. No evidence of pulmonary embolism. Normal heart size. No pericardial effusion. Nonaneurysmal aorta. No dissection is seen. Mediastinum/Nodes: No enlarged mediastinal, hilar, or axillary lymph nodes. Thyroid  gland, trachea, and esophagus demonstrate no significant findings. Lungs/Pleura: Lungs are clear. No pleural effusion or pneumothorax. Upper Abdomen: No acute abnormality. Musculoskeletal: No chest wall abnormality. No acute or significant osseous findings. Review of the MIP images confirms the above findings. IMPRESSION: 1. Negative for acute pulmonary embolus or aortic dissection. 2. Clear lungs. Electronically Signed   By: Jasmine Pang M.D.   On: 08/02/2023 19:23   DG Chest 2 View  Result Date: 08/02/2023 CLINICAL DATA:  Chest pain EXAM: CHEST - 2 VIEW COMPARISON:  11/23/2018 FINDINGS: The heart size and mediastinal contours are within normal limits. Both lungs are clear. The visualized skeletal structures are unremarkable. IMPRESSION: No active cardiopulmonary disease. Electronically Signed   By: Jasmine Pang M.D.   On: 08/02/2023 19:16   Disposition   Pt is being discharged home today in good condition.  Follow-up Plans & Appointments     Discharge Instructions     Amb Referral to Cardiac Rehabilitation   Complete by: As directed    Pt is very interested in starting CRP2 in [place]   Diagnosis:  Coronary Stents NSTEMI     After initial evaluation and assessments completed: Virtual Based Care may be provided alone or in conjunction with Phase 2 Cardiac Rehab based on patient barriers.: Yes   Intensive Cardiac Rehabilitation (ICR) [facility] location only OR Traditional Cardiac Rehabilitation (TCR) *If criteria for ICR are not met will enroll in TCR [facility] only): Yes   Diet - low sodium heart healthy   Complete by: As directed    Discharge instructions   Complete by: As directed    PLEASE DO NOT MISS ANY DOSES OF YOUR BRILINTA!!!!! Also keep a log of you blood pressures and bring back to your follow up appt. Please call the office with any questions.   Patients taking blood thinners should generally stay away from medicines like ibuprofen, Advil, Motrin, naproxen, and Aleve due to risk of  stomach bleeding. You may take Tylenol as directed or talk to your primary doctor about alternatives.  PLEASE ENSURE THAT YOU DO NOT RUN OUT OF YOUR BRILINTA. This medication is very important to remain on for at least one year. IF you have issues obtaining this medication due to cost please CALL the office 3-5 business  days prior to running out in order to prevent missing doses of this medication.   Radial Site Care Refer to this sheet in the next few weeks. These instructions provide you with information on caring for yourself after your procedure. Your caregiver may also give you more specific instructions. Your treatment has been planned according to current medical practices, but problems sometimes occur. Call your caregiver if you have any problems or questions after your procedure. HOME CARE INSTRUCTIONS You may shower the day after the procedure. Remove the bandage (dressing) and gently wash the site with plain soap and water. Gently pat the site dry.  Do not apply powder or lotion to the site.  Do not submerge the affected site in water for 3 to 5 days.  Inspect the site at least twice daily.  Do not flex or bend the affected arm for 24 hours.  No lifting over 5 pounds (2.3 kg) for 5 days after your procedure.  Do not drive home if you are discharged the same day of the procedure. Have someone else drive you.  You may drive 24 hours after the procedure unless otherwise instructed by your caregiver.  What to expect: Any bruising will usually fade within 1 to 2 weeks.  Blood that collects in the tissue (hematoma) may be painful to the touch. It should usually decrease in size and tenderness within 1 to 2 weeks.  SEEK IMMEDIATE MEDICAL CARE IF: You have unusual pain at the radial site.  You have redness, warmth, swelling, or pain at the radial site.  You have drainage (other than a small amount of blood on the dressing).  You have chills.  You have a fever or persistent symptoms for  more than 72 hours.  You have a fever and your symptoms suddenly get worse.  Your arm becomes pale, cool, tingly, or numb.  You have heavy bleeding from the site. Hold pressure on the site.   You have been prescribed sublingual nitroglycerin to take in case you develop chest pain. If you have an episode of chest pain, place one nitroglycerin tablet under your tongue. This can be repeated every 5 minutes for up to 3 doses. If chest pain persists, call an ambulance and go to an emergency department. Do not take Tadalafil(Cialis) when taking nitroglycerin. If you have taken Tadalafil (cialis) within 24 hours and you develop chest pain, DO NOT take nitroglycerine. Similarly, if you have taken nitroglycerin within 24 hours, do not take tadalafil (cialis)   Increase activity slowly   Complete by: As directed         Discharge Medications   Allergies as of 08/04/2023   No Known Allergies      Medication List     TAKE these medications    amLODipine 10 MG tablet Commonly known as: NORVASC Take 1 tablet (10 mg total) by mouth daily. Start taking on: August 05, 2023   aspirin EC 81 MG tablet Take 1 tablet (81 mg total) by mouth daily. Swallow whole. Start taking on: August 05, 2023   atorvastatin 80 MG tablet Commonly known as: LIPITOR Take 1 tablet (80 mg total) by mouth daily.   FERROUS SULFATE IRON PO Take 1 tablet by mouth daily.   losartan 100 MG tablet Commonly known as: COZAAR Take 100 mg by mouth daily.   metoprolol tartrate 50 MG tablet Commonly known as: LOPRESSOR Take 1 tablet (50 mg total) by mouth 2 (two) times daily. What changed: when to take this  multivitamin tablet Take 1 tablet by mouth daily.   nitroGLYCERIN 0.4 MG SL tablet Commonly known as: NITROSTAT Place 1 tablet (0.4 mg total) under the tongue every 5 (five) minutes as needed for chest pain. DO NOT take within 24 hours of taking medications for erectile dysfunction   tadalafil 5 MG  tablet Commonly known as: CIALIS Take 5 mg by mouth daily as needed for erectile dysfunction.   ticagrelor 90 MG Tabs tablet Commonly known as: BRILINTA Take 1 tablet (90 mg total) by mouth 2 (two) times daily.           Outstanding Labs/Studies    Duration of Discharge Encounter   Greater than 30 minutes including physician time.  Signed, Jonita Albee, PA-C 08/04/2023, 11:02 AM

## 2023-08-04 NOTE — Plan of Care (Signed)
Status post PCI to circumflex yesterday.  Plan for discharge today.  The lesion was managed medically.  He will continue aspirin and Brilinta at discharge.  Other recommendations will be detailed in the full discharge summary.  Gerri Spore T. Flora Lipps, MD, [facility]  [facility]  [address], [address] [place], [place] [address] [phone]  10:57 AM

## 2023-08-04 NOTE — Telephone Encounter (Signed)
   Transition of Care Follow-up Phone Call Request    Patient Name: Glenn Weber Date of Birth: Jan 18, 1981 Date of Encounter: 08/04/2023  Primary Care Provider:  [facility] Primary Cardiologist:  Maisie Fus, MD  [name] has been scheduled for a transition of care follow up appointment with a [facility] provider:  Robet Leu, PA-C on 8/28 at 2:30 PM  Please reach out to [name] within 48 hours to confirm appointment and review transition of care protocol questionnaire.  Jonita Albee, PA-C  08/04/2023, 7:55 AM

## 2023-08-05 NOTE — Progress Notes (Deleted)
Cardiology Office Note:  .   Date:  08/05/2023  ID:  [name], DOB 1981-05-26, MRN [id number] PCP: [facility], [facility]  [facility] Providers Cardiologist:  Maisie Fus, MD   History of Present Illness: .   Glenn Weber is a 42 y.o. male with a past medical history of hypertension, tobacco use, recent NSTEMI. Patient is followed by Dr. Wyline Mood and presents today for follow up   Patient was admitted to the cardiology service on 8/15 for treatment of NSTEMI.  His EKG demonstrated ST segment depressions in leads II, III, aVF, V4-V6 with associated T wave inversions. He underwent echocardiogram on 8/16 that showed EF 60-65%, no regional wall motion abnormalities, normal RV function, no significant valvular disease.  He was taken to the Cath Lab with Dr. Swaziland on 8/16 and was found to have multivessel CAD.  His culprit lesion was a high-grade lesion in the proximal left circumflex.  He was also noted to have moderate stenosis in the mid LAD, mid RCA, and severe stenosis in the second diagonal.  He was treated with DES x 1 in the proximal left circumflex.  Who was started on DAPT with recommendations to continue for 1 year uninterrupted.  He was also started on high-dose statin and counseled on tobacco cessation.  Of note, if patient has refractory angina despite optimal medical therapy, PCI of other lesions could be considered. He was discharged on 8/17    NSTEMI  CAD  - Patient was recently admitted from 8/15-8/17 with NSTEMI. hsTn 646, 790.  - Echo showed EF 60-65% with no regional wall motion abnormalities  - LHC revealed multivessel disease with severe stenosis in prox left circumflex, moderate stenosis in the mid LAD and mid RCA, and severe stenosis in the second diagonal. He was treated with DESx1 to the proximal left circumflex. Remainder of disease will be managed medically  - Continue DAPT with ASA, Brilinta uninterrupted for 1 year  - Continue lipitor 80 mg daily - LDL  70 during recent admission. Needs LFTs and lipid panel in 8 weeks  - Continue metoprolol tartrate 50 mg BID and amlodipine 5 mg daily     HTN  - Continue metoprolol tartrate 50 mg BID, losartan 100 mg daily, amlodipine to 10 mg daily  - Discussed importance of low sodium diet and increasing physical activity as tolerated    Tobacco Use  - Patient counseled on tobacco cessation   ROS: ***  Studies Reviewed: .        *** Risk Assessment/Calculations:   {Does this patient have ATRIAL FIBRILLATION?:[phone]}         Physical Exam:   VS:  There were no vitals taken for this visit.   Wt Readings from Last 3 Encounters:  08/03/23 181 lb (82.1 kg)  05/29/22 178 lb (80.7 kg)  05/24/22 178 lb (80.7 kg)    GEN: Well nourished, well developed in no acute distress NECK: No JVD; No carotid bruits CARDIAC: ***RRR, no murmurs, rubs, gallops RESPIRATORY:  Clear to auscultation without rales, wheezing or rhonchi  ABDOMEN: Soft, non-tender, non-distended EXTREMITIES:  No edema; No deformity   ASSESSMENT AND PLAN: .   *** {The patient has an active order for outpatient cardiac rehabilitation.   Please indicate if the patient is ready to start. Do NOT delete this.  It will auto delete.  Refresh note, then sign.              Click here to document readiness  and see contraindications.  :1}  Cardiac Rehabilitation Eligibility Assessment      {Are you ordering a CV Procedure (e.g. stress test, cath, DCCV, TEE, etc)?   Press F2        :[id number]}  Dispo: ***  Signed, Jonita Albee, PA-C

## 2023-08-06 ENCOUNTER — Encounter (HOSPITAL_COMMUNITY): Payer: Self-pay | Admitting: Cardiology

## 2023-08-06 NOTE — Telephone Encounter (Signed)
Pt is still admitted. Will call later today.

## 2023-08-06 NOTE — Telephone Encounter (Signed)
Left voicemail to return call to office.

## 2023-08-07 LAB — LIPOPROTEIN A (LPA): Lipoprotein (a): 185.8 nmol/L — ABNORMAL HIGH (ref <75.0)

## 2023-08-07 NOTE — Telephone Encounter (Signed)
Patient contacted regarding discharge from [facility] on 08/04/23.  Patient understands to follow up with provider Robet Leu on 08/15/23 at 2:30 pm at [facility]. Patient understands discharge instructions? Yes Patient understands medications and regiment? Yes Patient understands to bring all medications to this visit? Yes  Ask patient:  Are you enrolled in My Chart No, status pending. Stated he has signed up

## 2023-08-09 ENCOUNTER — Encounter: Payer: Self-pay | Admitting: Cardiology

## 2023-08-09 ENCOUNTER — Telehealth: Payer: Self-pay | Admitting: Internal Medicine

## 2023-08-09 NOTE — Telephone Encounter (Signed)
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  Do see per his discharge summary to stay out for 1 week.  Which is today.  He needs the letter ASAP for work.  He has F/U next week 8/28

## 2023-08-09 NOTE — Telephone Encounter (Signed)
Patient called stating he needs a work excuse stating he was in the hospital from 8/15 to 08/04/23, he also states that he was told that he can be out from work for two weeks to 30 days. He states he needs the letter ASAP.

## 2023-08-10 NOTE — Telephone Encounter (Signed)
Returned call to pt to pick up a letter for his work. Unable to LVM as the mailbox is full.

## 2023-08-11 IMAGING — CT CT RENAL STONE PROTOCOL
2 of 4 series · 17 of 46 positions shown, 19 images · non-contrast
Comparison: None.

CLINICAL DATA: Right flank pain.

EXAM:
CT ABDOMEN AND PELVIS WITHOUT CONTRAST
TECHNIQUE: Multidetector CT imaging of the abdomen and pelvis was performed
following the standard protocol without IV contrast.

[Series 2: axial st · axial · 0.84mm/px · z∈[+742,+1162]mm · 14 of 92 slices shown, 16 images]
[im 4/92  soft-tissue]
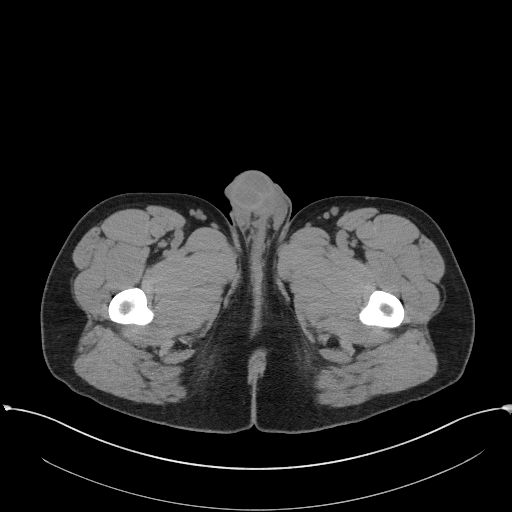
[im 4/92  bone]
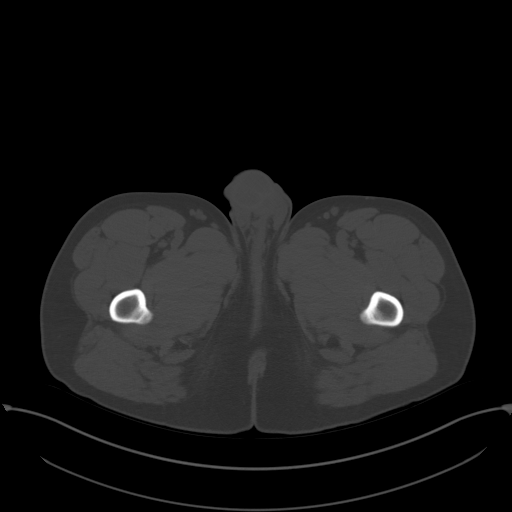
[im 12/92  soft-tissue]
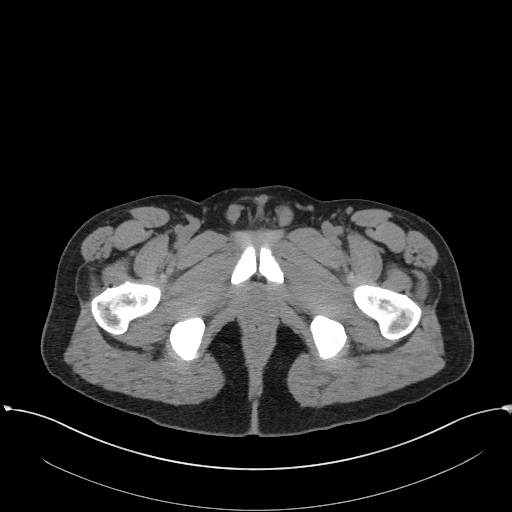
[im 19/92  soft-tissue]
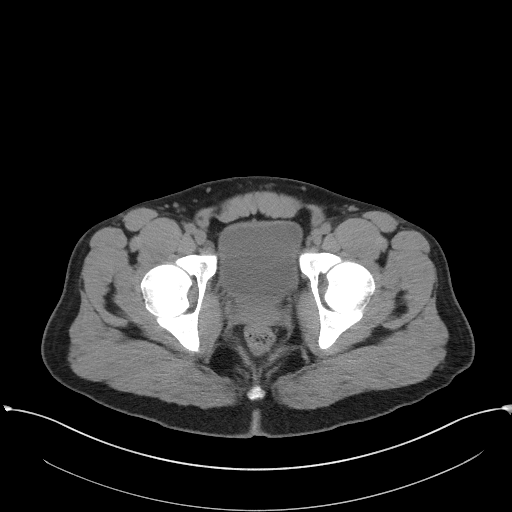
[im 23/92  soft-tissue]
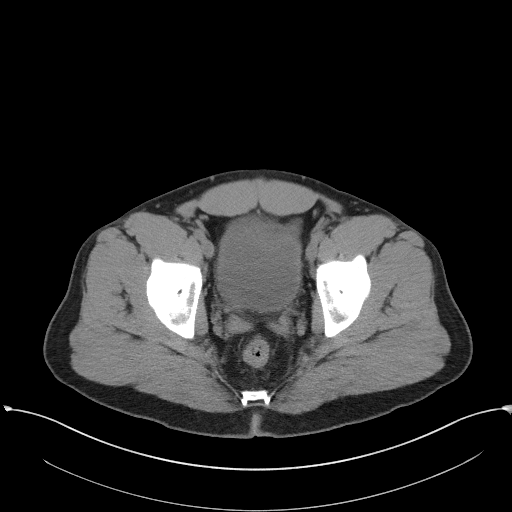
[im 31/92  soft-tissue]
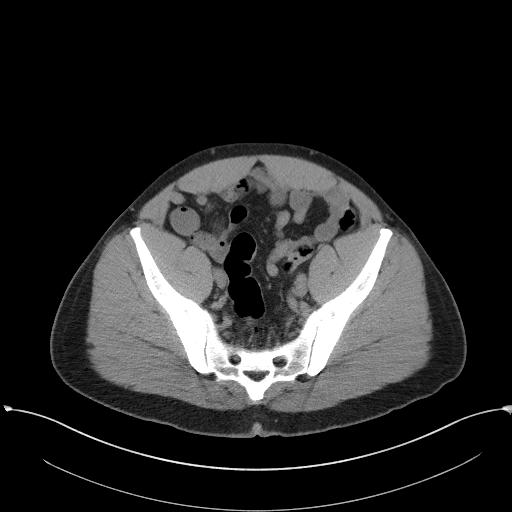
[im 38/92  soft-tissue]
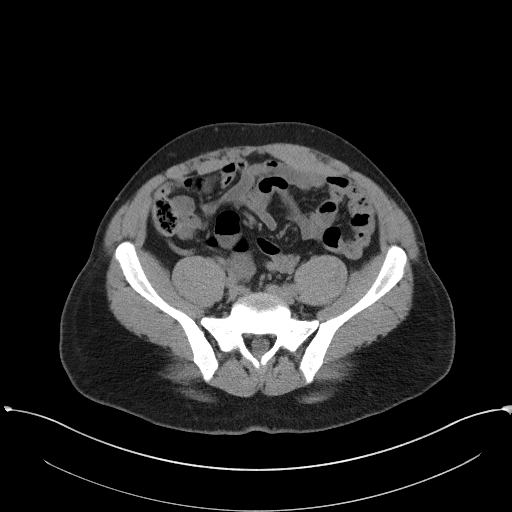
[im 42/92  soft-tissue]
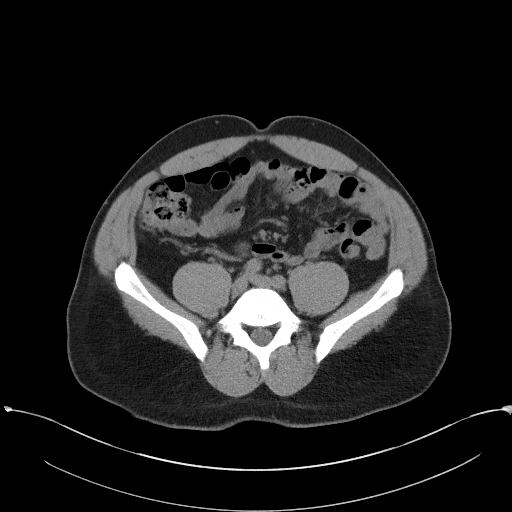
[im 50/92  soft-tissue]
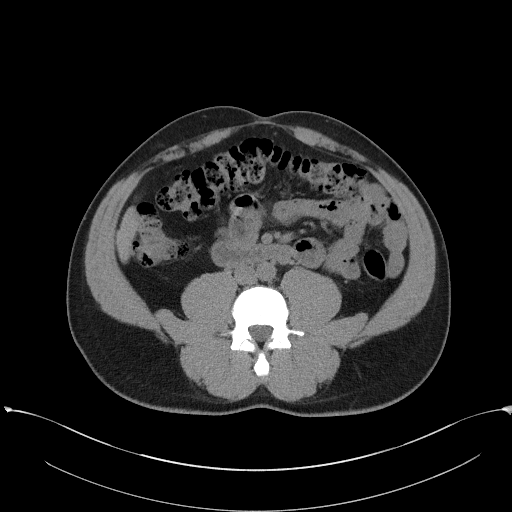
[im 54/92  soft-tissue]
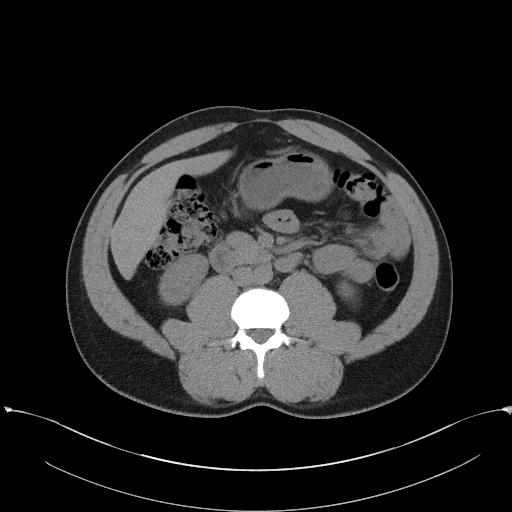
[im 54/92  bone]
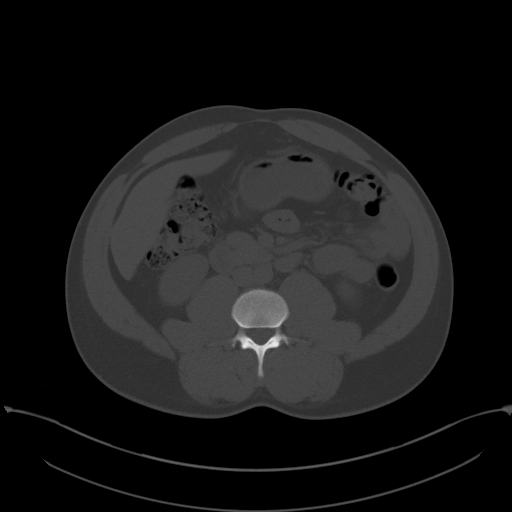
[im 61/92  soft-tissue]
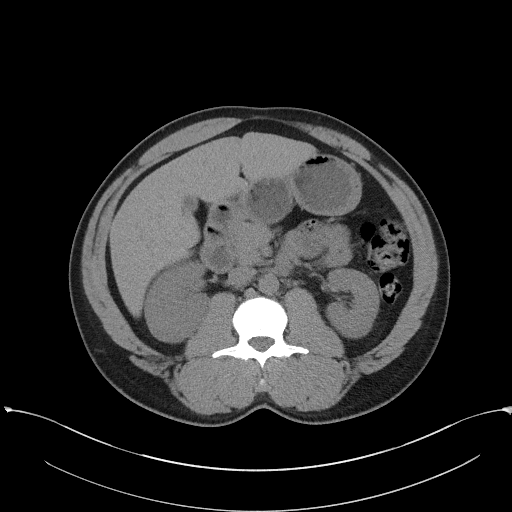
[im 69/92  soft-tissue]
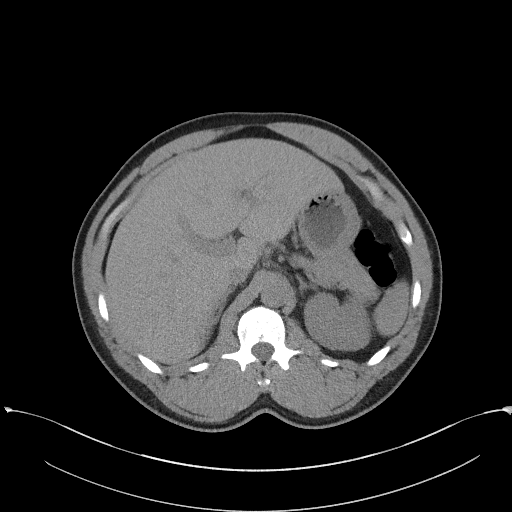
[im 73/92  soft-tissue]
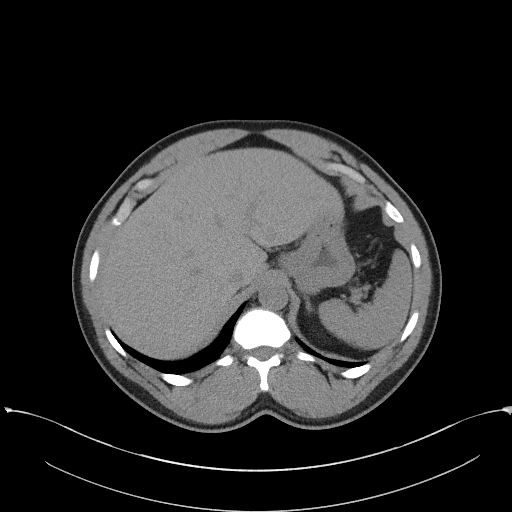
[im 80/92  soft-tissue]
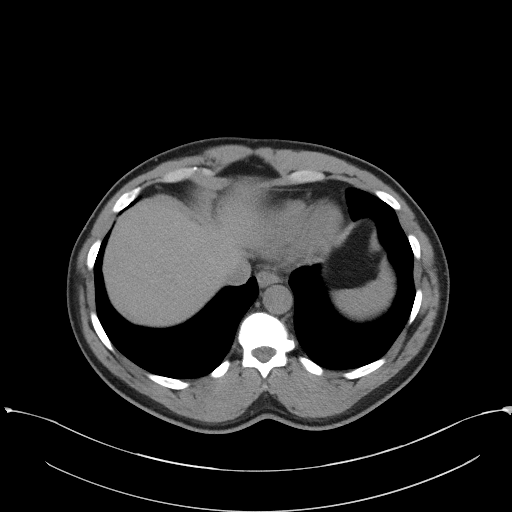
[im 88/92  soft-tissue]
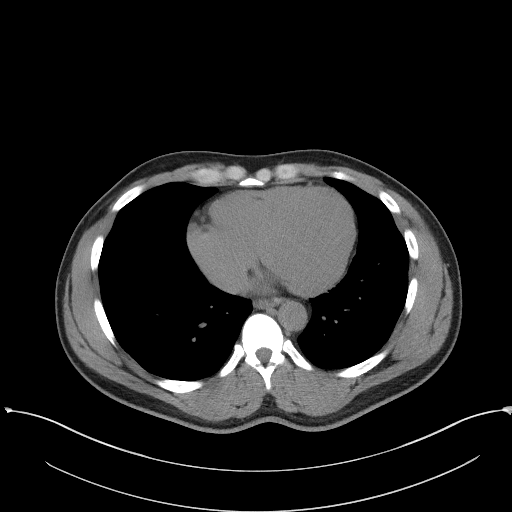

[Series 5: coronal st · coronal · 0.77mm/px · 3 of 93 slices shown]
[im 31/93  soft-tissue]
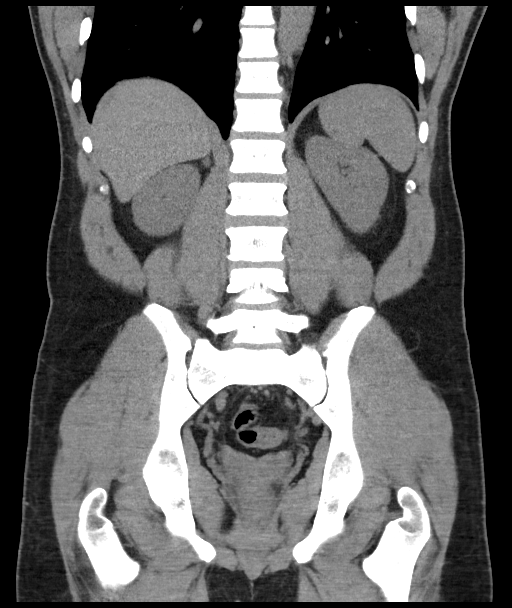
[im 41/93  soft-tissue]
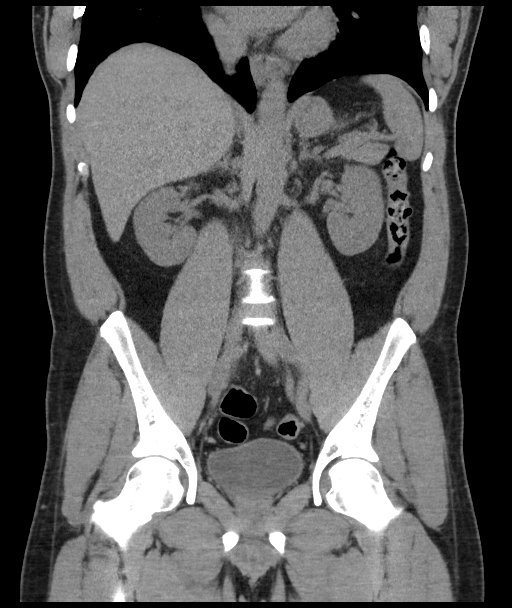
[im 52/93  soft-tissue]
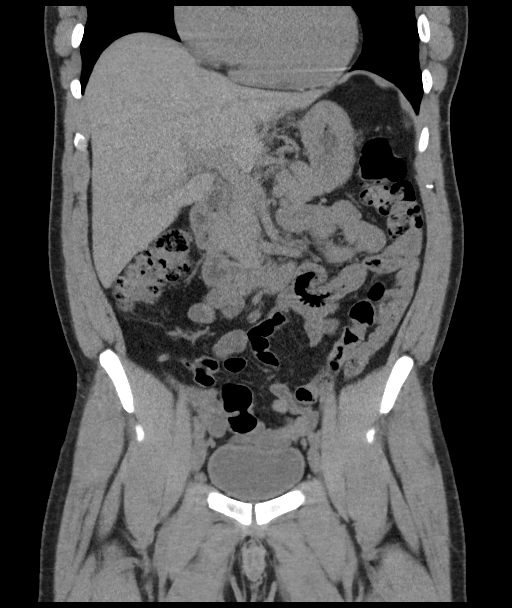

[17 of 46 positions shown; findings below may reference images not displayed]

FINDINGS: Lower chest: No acute abnormality.

Hepatobiliary: No focal liver abnormality is seen. The gallbladder
is contracted without evidence of gallstones, gallbladder wall
thickening, or biliary dilatation.

Pancreas: Unremarkable. No pancreatic ductal dilatation or
surrounding inflammatory changes.

Spleen: Normal in size without focal abnormality.

Adrenals/Urinary Tract: Adrenal glands are unremarkable. Kidneys are
normal, without renal calculi, focal lesion, or hydronephrosis.
Bladder is unremarkable.

Stomach/Bowel: Stomach is within normal limits. Appendix appears
normal. No evidence of bowel wall thickening, distention, or
inflammatory changes.

Vascular/Lymphatic: No significant vascular findings are present. No
enlarged abdominal or pelvic lymph nodes.

Reproductive: Prostate is unremarkable.

Other: No abdominal wall hernia or abnormality. No abdominopelvic
ascites.

Musculoskeletal: No acute or significant osseous findings.
IMPRESSION: No evidence of acute or active process within the abdomen or pelvis.

## 2023-08-13 ENCOUNTER — Telehealth (HOSPITAL_COMMUNITY): Payer: Self-pay

## 2023-08-13 NOTE — Telephone Encounter (Signed)
Called patient to see if he is interested in the Cardiac Rehab Program. Patient expressed interest. Explained scheduling process and went over insurance, patient verbalized understanding. Will contact patient for scheduling once f/u has been completed.  °

## 2023-08-13 NOTE — Telephone Encounter (Signed)
Pt insurance is active and benefits verified through Medicaid. Co-pay $0.00, DED $0.00/$0.00 met, out of pocket $0.00/$0.00 met, co-insurance 0%. No pre-authorization required. [employer], 08/13/23 @ 11:02AM, REF#[id number]   How many CR sessions are covered? (36 visits for TCR, 72 visits for ICR)TCR 36 Is this a lifetime maximum or an annual maximum? Lifetime Has the member used any of these services to date? No Is there a time limit (weeks/months) on start of program and/or program completion? No     Will contact patient to see if he is interested in the Cardiac Rehab Program. If interested, patient will need to complete follow up appt. Once completed, patient will be contacted for scheduling upon review by the RN Navigator.

## 2023-08-15 ENCOUNTER — Ambulatory Visit: Payer: Medicaid Other | Admitting: Cardiology

## 2023-08-15 NOTE — Progress Notes (Unsigned)
Office Visit    Patient Name: Glenn Weber Date of Encounter: 08/16/2023  Primary Care Provider:  [facility] Primary Cardiologist:  Maisie Fus, MD Primary Electrophysiologist: None   Past Medical History    Past Medical History:  Diagnosis Date   Hypertension    Past Surgical History:  Procedure Laterality Date   CORONARY STENT INTERVENTION N/A 08/03/2023   Procedure: CORONARY STENT INTERVENTION;  Surgeon: Swaziland, Peter M, MD;  Location: [facility] INVASIVE CV LAB;  Service: Cardiovascular;  Laterality: N/A;   LEFT HEART CATH AND CORONARY ANGIOGRAPHY N/A 08/03/2023   Procedure: LEFT HEART CATH AND CORONARY ANGIOGRAPHY;  Surgeon: Swaziland, Peter M, MD;  Location: [facility] INVASIVE CV LAB;  Service: Cardiovascular;  Laterality: N/A;    Allergies  No Known Allergies   History of Present Illness    Glenn Weber  is a 42 year old male with a PMH of CAD s/p NSTEMI 07/2023 with three-vessel disease with 95% LCx treated with PCI/DES x 1, 75% proximal RCA, 85% second diagonal, 65% mid LAD, HLD, HTN, tobacco abuse who presents today for post PCI follow-up.  Glenn Weber presented to the ED on 08/02/2023 with complaint of sudden onset of chest tightness with radiation to the arm and jaw.  Patient's blood pressure was elevated at 160/110 and troponins were elevated at 646-790 NSTEMI was diagnosed.  He was placed on heparin drip and loaded with aspirin in ED.  LHC was performed and revealed three-vessel disease with 95% LCx treated with PCI/DES x 1, 75% proximal RCA, 85% second diagonal, 65% mid LAD.  The lesions were deemed to be managed medically as appropriate and patient's chest pain subsided postprocedure.  He was placed on the aphtha for 1 year with Brilinta and aspirin.  He was also started on GDMT with high-dose statin with plan to return to Cath Lab if refractory angina occurs despite medical therapy.  Since last being seen in the office patient reports that he has been feeling  better with no chest pain or anginal equivalent since discharge.  His blood pressure today is controlled at 110/78 and heart rate is 77 bpm.  He is compliant with his current medications and denies any adverse reactions.  During today's visit we discussed the pathophysiology of cardiovascular disease and also reviewed his left heart cath procedure notes.  I was able to answer multiple questions from the patient and his wife regarding his cardiovascular health.  We also discussed Familia cardiovascular traits and I encouraged Mr. Kowal to discuss his condition with his sisters and his daughter.  Patient denies chest pain, palpitations, dyspnea, PND, orthopnea, nausea, vomiting, dizziness, syncope, edema, weight gain, or early satiety.  Home Medications    Current Outpatient Medications  Medication Sig Dispense Refill   amLODipine (NORVASC) 10 MG tablet Take 1 tablet (10 mg total) by mouth daily. 90 tablet 3   aspirin EC 81 MG tablet Take 1 tablet (81 mg total) by mouth daily. Swallow whole. 120 tablet 3   atorvastatin (LIPITOR) 80 MG tablet Take 1 tablet (80 mg total) by mouth daily. 90 tablet 3   Ferrous Sulfate Dried (FERROUS SULFATE IRON PO) Take 1 tablet by mouth daily.     losartan (COZAAR) 100 MG tablet Take 100 mg by mouth daily.     metoprolol tartrate (LOPRESSOR) 50 MG tablet Take 1 tablet (50 mg total) by mouth 2 (two) times daily. 180 tablet 3   Multiple Vitamin (MULTIVITAMIN) tablet Take 1 tablet by  mouth daily.     nitroGLYCERIN (NITROSTAT) 0.4 MG SL tablet Place 1 tablet (0.4 mg total) under the tongue every 5 (five) minutes as needed for chest pain. DO NOT take within 24 hours of taking medications for erectile dysfunction 100 tablet 3   tadalafil (CIALIS) 5 MG tablet Take 5 mg by mouth daily as needed for erectile dysfunction.     ticagrelor (BRILINTA) 90 MG TABS tablet Take 1 tablet (90 mg total) by mouth 2 (two) times daily. 60 tablet 11   No current facility-administered  medications for this visit.     Review of Systems  Please see the history of present illness.    (+) Mild shortness of breath   All other systems reviewed and are otherwise negative except as noted above.  Physical Exam    Wt Readings from Last 3 Encounters:  08/16/23 173 lb 9.6 oz (78.7 kg)  08/03/23 181 lb (82.1 kg)  05/29/22 178 lb (80.7 kg)   VS: Vitals:   08/16/23 1605  BP: 110/78  Pulse: 77  SpO2: 97%  ,Body mass index is 26.4 kg/m.  Constitutional:      Appearance: Healthy appearance. Not in distress.  Neck:     Vascular: JVD normal.  Pulmonary:     Effort: Pulmonary effort is normal.     Breath sounds: No wheezing. No rales. Diminished in the bases Cardiovascular:     Right radial Site is clean dry and intact with no evidence of hematoma or ecchymosis present S1. Normal S2.      Murmurs: There is no murmur.  Edema:    Peripheral edema absent.  Abdominal:     Palpations: Abdomen is soft non tender. There is no hepatomegaly.  Skin:    General: Skin is warm and dry.  Neurological:     General: No focal deficit present.     Mental Status: Alert and oriented to person, place and time.     Cranial Nerves: Cranial nerves are intact.  EKG/LABS/ Recent Cardiac Studies    ECG personally reviewed by me today -none completed today  Lab Results  Component Value Date   WBC 8.4 08/04/2023   HGB 14.3 08/04/2023   HCT 42.2 08/04/2023   MCV 94.4 08/04/2023   PLT 240 08/04/2023   Lab Results  Component Value Date   CREATININE 1.21 08/04/2023   BUN 7 08/04/2023   NA 137 08/04/2023   K 3.5 08/04/2023   CL 101 08/04/2023   CO2 25 08/04/2023   Lab Results  Component Value Date   ALT 19 03/14/2022   AST 54 (H) 03/14/2022   ALKPHOS 114 03/14/2022   BILITOT 0.5 03/14/2022   Lab Results  Component Value Date   CHOL 133 08/02/2023   HDL 46 08/02/2023   LDLCALC 70 08/02/2023   TRIG 85 08/02/2023   CHOLHDL 2.9 08/02/2023    Lab Results  Component Value  Date   HGBA1C 5.9 (H) 08/02/2023     Assessment & Plan    1.  Coronary artery disease/NSTEMI: -s/p three-vessel CAD with 95% circumflex lesion that was repaired with PCI/DES x 1, 85% OM1, 65% LAD and 75% RCA residual -Patient is interested in cardiac rehab and is fine to proceed at this time -Continue GDMT with Brilinta 90 mg twice daily, Lipitor 80 mg daily, ASA 81 mg daily -Patient was educated regarding Cialis and Nitrostat and is aware not to take both medications due to dangerous hypotension  2.  Essential hypertension: -Patient's blood  pressure today is controlled at 110/78 -Continue Norvasc 10 mg daily and losartan 100 mg daily  3.  Hyperlipidemia: -Patient's last LDL cholesterol was controlled at 70 -Continue Lipitor 80 mg daily -We will recheck LFTs and lipids in 8 weeks  4.  Tobacco abuse: -Mr .Doney is motivated to stop smoking and was advised to contact 1 [phone] quit NOW for further advisement -Patient was advised to contact us if he is interested in trying Wellbutrin or nicotine patches in the future    Cardiac Rehabilitation Eligibility Assessment  The patient is ready to start cardiac rehabilitation from a cardiac standpoint.     Disposition: Follow-up with Maisie Fus, MD or APP in 1 months    Medication Adjustments/Labs and Tests Ordered: Current medicines are reviewed at length with the patient today.  Concerns regarding medicines are outlined above.   Signed, Napoleon Form, Leodis Rains, NP 08/16/2023, 6:11 PM [facility]

## 2023-08-16 ENCOUNTER — Ambulatory Visit: Payer: Medicaid Other | Attending: Cardiology | Admitting: Nurse Practitioner

## 2023-08-16 ENCOUNTER — Encounter: Payer: Self-pay | Admitting: Nurse Practitioner

## 2023-08-16 VITALS — BP 110/78 | HR 77 | Ht 68.0 in | Wt 173.6 lb

## 2023-08-16 DIAGNOSIS — I2510 Atherosclerotic heart disease of native coronary artery without angina pectoris: Secondary | ICD-10-CM | POA: Diagnosis not present

## 2023-08-16 DIAGNOSIS — Z720 Tobacco use: Secondary | ICD-10-CM

## 2023-08-16 DIAGNOSIS — I10 Essential (primary) hypertension: Secondary | ICD-10-CM

## 2023-08-16 DIAGNOSIS — I214 Non-ST elevation (NSTEMI) myocardial infarction: Secondary | ICD-10-CM

## 2023-08-16 DIAGNOSIS — E785 Hyperlipidemia, unspecified: Secondary | ICD-10-CM

## 2023-08-16 NOTE — Patient Instructions (Addendum)
Medication Instructions:  Your physician recommends that you continue on your current medications as directed. Please refer to the Current Medication list given to you today. *If you need a refill on your cardiac medications before your next appointment, please call your pharmacy*   Lab Work: LIPIDS & LFTs IN 8 WEEKS (COMPLETE ON 10/11/23 OR THE WEEK OF) IF GOING TO [place] NO APPOINTMENT IS NEEDED IF COMING TO [address] JUST CALL AND REQUEST THE SCHEDULER ADD YOU TO THE SCHEDULER) If you have labs (blood work) drawn today and your tests are completely normal, you will receive your results only by: MyChart Message (if you have MyChart) OR A paper copy in the mail If you have any lab test that is abnormal or we need to change your treatment, we will call you to review the results.   Testing/Procedures: NONE ORDERED   Follow-Up: At [facility], you and your health needs are our priority.  As part of our continuing mission to provide you with exceptional heart care, we have created designated Provider Care Teams.  These Care Teams include your primary Cardiologist (physician) and Advanced Practice Providers (APPs -  Physician Assistants and Nurse Practitioners) who all work together to provide you with the care you need, when you need it.  We recommend signing up for the patient portal called "MyChart".  Sign up information is provided on this After Visit Summary.  MyChart is used to connect with patients for Virtual Visits (Telemedicine).  Patients are able to view lab/test results, encounter notes, upcoming appointments, etc.  Non-urgent messages can be sent to your provider as well.   To learn more about what you can do with MyChart, go to [email or web address].    Your next appointment:   1 month(s)  Provider:   Robin Searing, NP     Then, Maisie Fus, MD will plan to see you again in 3 month(s).    Other Instructions  [employer]  CALCIUM SCORE  $99.00 1-800-QUIT-NOW LP(a) LAB TEST

## 2023-08-23 ENCOUNTER — Telehealth (HOSPITAL_COMMUNITY): Payer: Self-pay

## 2023-08-23 ENCOUNTER — Ambulatory Visit (HOSPITAL_COMMUNITY): Payer: Medicaid Other

## 2023-08-23 NOTE — Telephone Encounter (Signed)
Called pt regarding orientation appointment today. Pt decided to cancel, all appts removed. Pt wants to reschedule for another date.   Jonna Coup, MS, ACSM-CEP 08/23/2023 1:25 PM

## 2023-08-24 ENCOUNTER — Telehealth (HOSPITAL_COMMUNITY): Payer: Self-pay

## 2023-08-24 NOTE — Telephone Encounter (Signed)
Pt insurance is active through [place]. Ref# [id number]    Will fax over [facility] Reimbursement form to Dr. Wyline Mood

## 2023-08-27 ENCOUNTER — Ambulatory Visit (HOSPITAL_COMMUNITY): Payer: Medicaid Other

## 2023-08-28 ENCOUNTER — Telehealth: Payer: Self-pay

## 2023-08-28 NOTE — Telephone Encounter (Signed)
Received a letter from [facility] requesting patient records,last office note,discharge summary,EKG,labs faxed to fax # [phone].

## 2023-08-29 ENCOUNTER — Ambulatory Visit (HOSPITAL_COMMUNITY): Payer: Medicaid Other

## 2023-08-31 ENCOUNTER — Ambulatory Visit (HOSPITAL_COMMUNITY): Payer: Medicaid Other

## 2023-09-03 ENCOUNTER — Ambulatory Visit (HOSPITAL_COMMUNITY): Payer: Medicaid Other

## 2023-09-04 ENCOUNTER — Other Ambulatory Visit (HOSPITAL_COMMUNITY): Payer: Self-pay

## 2023-09-05 ENCOUNTER — Ambulatory Visit (HOSPITAL_COMMUNITY): Payer: Medicaid Other

## 2023-09-07 ENCOUNTER — Ambulatory Visit (HOSPITAL_COMMUNITY): Payer: Medicaid Other

## 2023-09-10 ENCOUNTER — Ambulatory Visit (HOSPITAL_COMMUNITY): Payer: Medicaid Other

## 2023-09-12 ENCOUNTER — Ambulatory Visit (HOSPITAL_COMMUNITY): Payer: Medicaid Other

## 2023-09-14 ENCOUNTER — Ambulatory Visit (HOSPITAL_COMMUNITY): Payer: Medicaid Other

## 2023-09-17 ENCOUNTER — Ambulatory Visit (HOSPITAL_COMMUNITY): Payer: Medicaid Other

## 2023-09-19 ENCOUNTER — Ambulatory Visit (HOSPITAL_COMMUNITY): Payer: Medicaid Other

## 2023-09-20 NOTE — Progress Notes (Signed)
Cardiology Office Note    Patient Name: Glenn Weber Date of Encounter: 09/21/2023  Primary Care Provider:  [facility] Primary Cardiologist:  Maisie Fus, MD Primary Electrophysiologist: None   Past Medical History    Past Medical History:  Diagnosis Date   Hypertension     History of Present Illness  [name]  is a 42 year old male with a PMH of CAD s/p NSTEMI 07/2023 with three-vessel disease with 95% LCx treated with PCI/DES x 1, 75% proximal RCA, 85% second diagonal, 65% mid LAD, HLD, HTN, tobacco abuse who presents today for follow-up.  Mr. Hamberger was seen last on 08/16/2023 after LHC with PCI/DES to left circumflex.  During visit patient ported no chest pain and blood pressure is well-controlled.  He was motivated to stop smoking and was advised to contact 1 and to quit NOW.  Patient was also interested in pursuing cardiac rehab.  During today's visit the patient reports that he has been doing well with no new cardiac complaints since previous follow-up.  He is experiencing some shortness of breath with Brilinta but would like to continue taking medications at this time.  He feels that his shortness of breath is not limiting any activity and is not bothersome.  His blood pressure today is well-controlled at 116/82 and heart rate is 79 bpm.  He is planning to participate in cardiac rehab and is looking forward to getting back to exercising.  He is still smoking and is currently motivated to stop.  We discussed strategies such as Chantix, Wellbutrin, and nicotine patches.  He is tolerating his other medications without any adverse reactions..  Patient denies chest pain, palpitations, dyspnea, PND, orthopnea, nausea, vomiting, dizziness, syncope, edema, weight gain, or early satiety.   Review of Systems  Please see the history of present illness.    All other systems reviewed and are otherwise negative except as noted above.  Physical Exam    Wt Readings from  Last 3 Encounters:  09/21/23 173 lb (78.5 kg)  08/16/23 173 lb 9.6 oz (78.7 kg)  08/03/23 181 lb (82.1 kg)   VS: Vitals:   09/21/23 1514  BP: 116/82  Pulse: 79  SpO2: 95%  ,Body mass index is 26.3 kg/m. GEN: Well nourished, well developed in no acute distress Neck: No JVD; No carotid bruits Pulmonary: Clear to auscultation without rales, wheezing or rhonchi  Cardiovascular: Normal rate. Regular rhythm. Normal S1. Normal S2.   Murmurs: There is no murmur.  ABDOMEN: Soft, non-tender, non-distended EXTREMITIES:  No edema; No deformity   EKG/LABS/ Recent Cardiac Studies   ECG personally reviewed by me today -none completed today  Risk Assessment/Calculations:          Lab Results  Component Value Date   WBC 8.4 08/04/2023   HGB 14.3 08/04/2023   HCT 42.2 08/04/2023   MCV 94.4 08/04/2023   PLT 240 08/04/2023   Lab Results  Component Value Date   CREATININE 1.21 08/04/2023   BUN 7 08/04/2023   NA 137 08/04/2023   K 3.5 08/04/2023   CL 101 08/04/2023   CO2 25 08/04/2023   Lab Results  Component Value Date   CHOL 133 08/02/2023   HDL 46 08/02/2023   LDLCALC 70 08/02/2023   TRIG 85 08/02/2023   CHOLHDL 2.9 08/02/2023    Lab Results  Component Value Date   HGBA1C 5.9 (H) 08/02/2023   Assessment & Plan    1.Coronary artery disease/NSTEMI: -s/p three-vessel CAD  with 95% circumflex lesion that was repaired with PCI/DES x 1, 85% OM1, 65% LAD and 75% RCA residual -Today patient denies any chest pain or angina -Continue current GDMT with ASA 81 mg, Lipitor 80 mg, Brilinta 90 mg twice daily, as needed Nitrostat 0.4 mg -We will change metoprolol to tartrate to Toprol-XL 50 mg daily -Patient is cleared to begin cardiac rehab  2.Essential hypertension: -Patient's blood pressure today is well-controlled at 116/82 -Continue Toprol-XL 50 mg daily, losartan 100 mg daily, Norvasc 10 mg daily  3.Hyperlipidemia: -Patient's last LDL cholesterol was controlled at  70 -Continue Lipitor 80 mg daily -We will recheck LFTs and lipids in 8 weeks  4.Tobacco abuse:  -Patient continues to smoke however is currently motivated to stop. -He was advised to call 1 [phone] quit NOW  5.  Shortness of breath: -Patient reports shortness of breath that occurs after taking his dose of Brilinta. -We discussed discontinuing and starting Plavix 75 mg if necessary however patient would like to continue Brilinta at this time.    Cardiac Rehabilitation Eligibility Assessment  The patient is ready to start cardiac rehabilitation from a cardiac standpoint.     Disposition: Follow-up with Maisie Fus, MD or APP in 3 months    Signed, Napoleon Form, Leodis Rains, NP 09/21/2023, 5:25 PM [facility]

## 2023-09-21 ENCOUNTER — Ambulatory Visit (HOSPITAL_COMMUNITY): Payer: Medicaid Other

## 2023-09-21 ENCOUNTER — Encounter: Payer: Self-pay | Admitting: Nurse Practitioner

## 2023-09-21 ENCOUNTER — Ambulatory Visit: Payer: Medicaid Other | Attending: Nurse Practitioner | Admitting: Nurse Practitioner

## 2023-09-21 VITALS — BP 116/82 | HR 79 | Ht 68.0 in | Wt 173.0 lb

## 2023-09-21 DIAGNOSIS — I2510 Atherosclerotic heart disease of native coronary artery without angina pectoris: Secondary | ICD-10-CM | POA: Diagnosis not present

## 2023-09-21 DIAGNOSIS — E785 Hyperlipidemia, unspecified: Secondary | ICD-10-CM | POA: Diagnosis not present

## 2023-09-21 DIAGNOSIS — R0602 Shortness of breath: Secondary | ICD-10-CM

## 2023-09-21 DIAGNOSIS — I10 Essential (primary) hypertension: Secondary | ICD-10-CM | POA: Diagnosis not present

## 2023-09-21 DIAGNOSIS — Z720 Tobacco use: Secondary | ICD-10-CM | POA: Diagnosis not present

## 2023-09-21 MED ORDER — AMLODIPINE BESYLATE 10 MG PO TABS: 10.0000 mg | ORAL_TABLET | Freq: Every day | ORAL | 1 refills | Status: DC

## 2023-09-21 MED ORDER — LOSARTAN POTASSIUM 100 MG PO TABS: 100.0000 mg | ORAL_TABLET | Freq: Every day | ORAL | 1 refills | Status: DC

## 2023-09-21 MED ORDER — ATORVASTATIN CALCIUM 80 MG PO TABS: 80.0000 mg | ORAL_TABLET | Freq: Every day | ORAL | 3 refills | Status: DC

## 2023-09-21 MED ORDER — NITROGLYCERIN 0.4 MG SL SUBL: 0.4000 mg | SUBLINGUAL_TABLET | SUBLINGUAL | 3 refills | Status: AC | PRN

## 2023-09-21 MED ORDER — TICAGRELOR 90 MG PO TABS: 90.0000 mg | ORAL_TABLET | Freq: Two times a day (BID) | ORAL | 1 refills | Status: DC

## 2023-09-21 MED ORDER — METOPROLOL SUCCINATE ER 50 MG PO TB24: 50.0000 mg | ORAL_TABLET | Freq: Every day | ORAL | 1 refills | Status: DC

## 2023-09-21 NOTE — Patient Instructions (Addendum)
Medication Instructions:  STOP Metoprolol Tartrate  START Toprol XL 50mg  Take 1 tablet once a day  *If you need a refill on your cardiac medications before your next appointment, please call your pharmacy*   Lab Work: None ordered   Testing/Procedures: None Ordered   Follow-Up: At [facility], you and your health needs are our priority.  As part of our continuing mission to provide you with exceptional heart care, we have created designated Provider Care Teams.  These Care Teams include your primary Cardiologist (physician) and Advanced Practice Providers (APPs -  Physician Assistants and Nurse Practitioners) who all work together to provide you with the care you need, when you need it.  We recommend signing up for the patient portal called "MyChart".  Sign up information is provided on this After Visit Summary.  MyChart is used to connect with patients for Virtual Visits (Telemedicine).  Patients are able to view lab/test results, encounter notes, upcoming appointments, etc.  Non-urgent messages can be sent to your provider as well.   To learn more about what you can do with MyChart, go to [email or web address].    Your next appointment:   3 month(s)  Provider:   [name], MD  Other Instructions

## 2023-09-24 ENCOUNTER — Ambulatory Visit (HOSPITAL_COMMUNITY): Payer: Medicaid Other

## 2023-09-26 ENCOUNTER — Telehealth (HOSPITAL_COMMUNITY): Payer: Self-pay

## 2023-09-26 ENCOUNTER — Ambulatory Visit (HOSPITAL_COMMUNITY): Payer: Medicaid Other

## 2023-09-26 NOTE — Telephone Encounter (Signed)
Refaxed medicaid form.   Placed referral back in medicaid folder.

## 2023-09-28 ENCOUNTER — Ambulatory Visit (HOSPITAL_COMMUNITY): Payer: Medicaid Other

## 2023-10-01 ENCOUNTER — Ambulatory Visit (HOSPITAL_COMMUNITY): Payer: Medicaid Other

## 2023-10-03 ENCOUNTER — Ambulatory Visit (HOSPITAL_COMMUNITY): Payer: Medicaid Other

## 2023-10-05 ENCOUNTER — Ambulatory Visit (HOSPITAL_COMMUNITY): Payer: Medicaid Other

## 2023-10-08 ENCOUNTER — Ambulatory Visit (HOSPITAL_COMMUNITY): Payer: Medicaid Other

## 2023-10-10 ENCOUNTER — Ambulatory Visit (HOSPITAL_COMMUNITY): Payer: Medicaid Other

## 2023-10-12 ENCOUNTER — Ambulatory Visit (HOSPITAL_COMMUNITY): Payer: Medicaid Other

## 2023-10-15 ENCOUNTER — Ambulatory Visit (HOSPITAL_COMMUNITY): Payer: Medicaid Other

## 2023-10-17 ENCOUNTER — Ambulatory Visit (HOSPITAL_COMMUNITY): Payer: Medicaid Other

## 2023-10-17 ENCOUNTER — Telehealth (HOSPITAL_COMMUNITY): Payer: Self-pay

## 2023-10-17 NOTE — Telephone Encounter (Signed)
Recv'ed incomplete Medicaid form back, will refax form.   Placed pt ppw back in [facility] folder.

## 2023-10-19 ENCOUNTER — Ambulatory Visit (HOSPITAL_COMMUNITY): Payer: Medicaid Other

## 2023-10-22 ENCOUNTER — Ambulatory Visit (HOSPITAL_COMMUNITY): Payer: Medicaid Other

## 2023-10-24 ENCOUNTER — Ambulatory Visit (HOSPITAL_COMMUNITY): Payer: Medicaid Other

## 2023-10-26 ENCOUNTER — Ambulatory Visit (HOSPITAL_COMMUNITY): Payer: Medicaid Other

## 2023-10-29 ENCOUNTER — Ambulatory Visit (HOSPITAL_COMMUNITY): Payer: Medicaid Other

## 2023-10-31 ENCOUNTER — Ambulatory Visit (HOSPITAL_COMMUNITY): Payer: Medicaid Other

## 2023-11-02 ENCOUNTER — Ambulatory Visit (HOSPITAL_COMMUNITY): Payer: Medicaid Other

## 2023-11-05 ENCOUNTER — Ambulatory Visit (HOSPITAL_COMMUNITY): Payer: Medicaid Other

## 2023-11-07 ENCOUNTER — Ambulatory Visit (HOSPITAL_COMMUNITY): Payer: Medicaid Other

## 2023-11-09 ENCOUNTER — Ambulatory Visit (HOSPITAL_COMMUNITY): Payer: Medicaid Other

## 2023-11-12 ENCOUNTER — Ambulatory Visit (HOSPITAL_COMMUNITY): Payer: Medicaid Other

## 2023-11-14 ENCOUNTER — Ambulatory Visit (HOSPITAL_COMMUNITY): Payer: Medicaid Other

## 2024-01-01 ENCOUNTER — Ambulatory Visit: Payer: Medicaid Other | Attending: Internal Medicine | Admitting: Internal Medicine

## 2024-01-01 NOTE — Progress Notes (Deleted)
  Cardiology Office Note:  .   Date:  01/01/2024  ID:  Glenn Weber, DOB Sep 06, 1981, MRN [id number] PCP: [facility]  [facility] Providers Cardiologist:  Alvan Ronal BRAVO, MD { Click to update primary MD,subspecialty MD or APP then REFRESH:1}   History of Present Illness: .   Glenn Weber is a 43 y.o. male CAD s/p NSTEMI 07/2023 with three-vessel disease with 95% LCx treated with PCI/DES x 1, 75% proximal RCA, 85% second diagonal, 65% mid LAD, HLD, HTN, tobacco who is here for follow up. He was following with [name] in clinic.  He is being managed for CAD with GDMT.  He was having shortness of breath on Brilinta  and was transitioned to Plavix.  Considering his significant disease it is recommended that he remain on DAPT for 1 year.  Otherwise he has normal LV function and no significant valve disease.  ROS:  per HPI otherwise negative   Studies Reviewed: .        *** Risk Assessment/Calculations:   {Does this patient have ATRIAL FIBRILLATION?:[phone]} No BP recorded.  {Refresh Note OR Click here to enter BP  :1}***       Physical Exam:   VS:  There were no vitals taken for this visit.   Wt Readings from Last 3 Encounters:  09/21/23 173 lb (78.5 kg)  08/16/23 173 lb 9.6 oz (78.7 kg)  08/03/23 181 lb (82.1 kg)    GEN: Well nourished, well developed in no acute distress NECK: No JVD; No carotid bruits CARDIAC: ***RRR, no murmurs, rubs, gallops RESPIRATORY:  Clear to auscultation without rales, wheezing or rhonchi  ABDOMEN: Soft, non-tender, non-distended EXTREMITIES:  No edema; No deformity   ASSESSMENT AND PLAN: .   CAD -Continue DAPT until August 2025 -Continue Lipitor 80 mg daily, LDL is at goal -Continue beta-blocker -It is safe for him to use Cialis as long as he does not use it on the same day as nitroglycerin   HTN - *** controlled -Continue current antihypertensives    Dispo: Follow-up with an APP in 6 months  Signed, Rissie Sculley, Ronal BRAVO,  MD

## 2024-01-22 NOTE — Progress Notes (Signed)
  Cardiology Office Note:  .   Date:  01/22/2024  ID:  Glenn Weber, DOB 1981/03/24, MRN [id number] PCP: [facility]  [facility] Providers Cardiologist:  Alvan Ronal BRAVO, MD    History of Present Illness: .   Glenn Weber is a 43 y.o. male CAD s/p NSTEMI 07/2023 with three-vessel disease with 95% LCx treated with PCI/DES x 1, 75% proximal RCA, 85% second diagonal, 65% mid LAD, HLD, HTN, tobacco who is here for follow up. He was following with [name] in clinic.  He is being managed for CAD with GDMT.  He was having shortness of breath on Brilinta  and was transitioned to Plavix.  Considering his significant disease it is recommended that he remain on DAPT for 1 year.  Otherwise he has normal LV function and no significant valve disease.   Today, he notes some sharp chest pains, sporadically. Not very concerning, thought may be gas. Similar to prior cath. Notes some leg swelling. No swelling today. No orthopnea or PND. Notes bruising in his leg.   ROS:  per HPI otherwise negative   Studies Reviewed: .         Risk Assessment/Calculations:        Physical Exam:   VS:   Vitals:   01/23/24 1041  BP: (!) 134/92  Pulse: 73  SpO2: 96%    Wt Readings from Last 3 Encounters:  09/21/23 173 lb (78.5 kg)  08/16/23 173 lb 9.6 oz (78.7 kg)  08/03/23 181 lb (82.1 kg)    GEN: Well nourished, well developed in no acute distress NECK: No JVD CARDIAC: RRR, no murmurs, rubs, gallops RESPIRATORY:  Clear to auscultation without rales, wheezing or rhonchi  ABDOMEN: Soft, non-tender, non-distended EXTREMITIES:  No edema; No deformity   ASSESSMENT AND PLAN: .   CAD Having anginal equivalent symptoms Multivessel Dx. Cuprit prox Lcx s/p DES 08/03/2023 EF is normal -Continue DAPT until August 2025 -Continue Lipitor 80 mg daily, LDL is at goal -Continue metoprolol  50 mg xL -It is safe for him to use Cialis as long as he does not use it on the same day as  nitroglycerin  --> Will repeat an exercise Myoview .  If it is normal then he can proceed with cardiac rehab   HTN - Ok control -Continue current antihypertensives    Dispo: Follow-up with an APP in 6 months  Signed, Shenequa Howse, Ronal BRAVO, MD

## 2024-01-23 ENCOUNTER — Ambulatory Visit: Payer: Medicaid Other | Attending: Internal Medicine | Admitting: Internal Medicine

## 2024-01-23 ENCOUNTER — Encounter: Payer: Self-pay | Admitting: Internal Medicine

## 2024-01-23 VITALS — BP 134/92 | HR 73 | Ht 68.0 in | Wt 180.2 lb

## 2024-01-23 DIAGNOSIS — I2510 Atherosclerotic heart disease of native coronary artery without angina pectoris: Secondary | ICD-10-CM

## 2024-01-23 DIAGNOSIS — R0602 Shortness of breath: Secondary | ICD-10-CM

## 2024-01-23 MED ORDER — METOPROLOL SUCCINATE ER 50 MG PO TB24: 50.0000 mg | ORAL_TABLET | Freq: Every day | ORAL | 1 refills | Status: AC

## 2024-01-23 NOTE — Patient Instructions (Signed)
 Medication Instructions:  No changes  *If you need a refill on your cardiac medications before your next appointment, please call your pharmacy*   Lab Work: None  If you have labs (blood work) drawn today and your tests are completely normal, you will receive your results only by: MyChart Message (if you have MyChart) OR A paper copy in the mail If you have any lab test that is abnormal or we need to change your treatment, we will call you to review the results.   Testing/Procedures: Exercise myoview  Your physician has requested that you have an exercise stress myoview . For further information please visit [email or web address] Please follow instruction sheet, as given.   The test will take approximately 3 to 4 hours to complete; you may bring reading material.  If someone comes with you to your appointment, they will need to remain in the main lobby due to limited space in the testing area. **If you are pregnant or breastfeeding, please notify the nuclear lab prior to your appointment**  How to prepare for your Myocardial Perfusion Test: Do not eat or drink 3 hours prior to your test, except you may have water. Do not consume products containing caffeine (regular or decaffeinated) 12 hours prior to your test. (ex: coffee, chocolate, sodas, tea). Do bring a list of your current medications with you.  If not listed below, you may take your medications as normal. Do wear comfortable clothes (no dresses or overalls) and walking shoes, tennis shoes preferred (No heels or open toe shoes are allowed). Do NOT wear cologne, perfume, aftershave, or lotions (deodorant is allowed). If these instructions are not followed your test will have to be rescheduled.      Follow-Up: At [facility], you and your health needs are our priority.  As part of our continuing mission to provide you with exceptional heart care, we have created designated Provider Care Teams.  These Care Teams include your  primary Cardiologist (physician) and Advanced Practice Providers (APPs -  Physician Assistants and Nurse Practitioners) who all work together to provide you with the care you need, when you need it.  We recommend signing up for the patient portal called MyChart.  Sign up information is provided on this After Visit Summary.  MyChart is used to connect with patients for Virtual Visits (Telemedicine).  Patients are able to view lab/test results, encounter notes, upcoming appointments, etc.  Non-urgent messages can be sent to your provider as well.   To learn more about what you can do with MyChart, go to [email or web address].    Your next appointment:   6 month(s)  Provider:   With any App   Other Instructions

## 2024-01-25 ENCOUNTER — Telehealth (HOSPITAL_COMMUNITY): Payer: Self-pay | Admitting: *Deleted

## 2024-01-25 NOTE — Telephone Encounter (Signed)
 Mailbox full anable to leave instructions for MPI study.

## 2024-01-31 ENCOUNTER — Ambulatory Visit (HOSPITAL_COMMUNITY): Payer: Medicaid Other | Attending: Internal Medicine

## 2024-01-31 DIAGNOSIS — I2510 Atherosclerotic heart disease of native coronary artery without angina pectoris: Secondary | ICD-10-CM | POA: Diagnosis present

## 2024-01-31 DIAGNOSIS — R0602 Shortness of breath: Secondary | ICD-10-CM | POA: Diagnosis present

## 2024-01-31 MED ORDER — TECHNETIUM TC 99M TETROFOSMIN IV KIT
30.7000 | PACK | Freq: Once | INTRAVENOUS | Status: DC | PRN
Start: 2024-01-31 — End: 2024-02-05

## 2024-01-31 MED ORDER — TECHNETIUM TC 99M TETROFOSMIN IV KIT
9.8000 | PACK | Freq: Once | INTRAVENOUS | Status: AC | PRN
  Administered 2024-01-31: 9.8 via INTRAVENOUS

## 2024-02-04 ENCOUNTER — Ambulatory Visit (HOSPITAL_COMMUNITY): Payer: Medicaid Other

## 2024-02-05 ENCOUNTER — Ambulatory Visit (HOSPITAL_COMMUNITY): Payer: Medicaid Other | Attending: Cardiology

## 2024-02-05 ENCOUNTER — Other Ambulatory Visit: Payer: Self-pay | Admitting: Internal Medicine

## 2024-02-05 DIAGNOSIS — I2510 Atherosclerotic heart disease of native coronary artery without angina pectoris: Secondary | ICD-10-CM | POA: Diagnosis not present

## 2024-02-05 DIAGNOSIS — R0602 Shortness of breath: Secondary | ICD-10-CM

## 2024-02-05 LAB — MYOCARDIAL PERFUSION IMAGING
Base ST Depression (mm): 0 mm
Estimated workload: 1
Exercise duration (min): 1 min
Exercise duration (sec): 0 s
LV dias vol: 105 mL (ref 62–150)
LV sys vol: 51 mL
MPHR: 176 {beats}/min
Nuc Stress EF: 51 %
Peak HR: 115 {beats}/min
Percent HR: 65 %
Rest HR: 79 {beats}/min
Rest Nuclear Isotope Dose: 9.8 mCi
Rest Nuclear Isotope Dose: 9.8 mCi
SDS: 0
SRS: 0
SSS: 0
ST Depression (mm): 0 mm
Stress Nuclear Isotope Dose: 32.9 mCi
Stress Nuclear Isotope Dose: 32.8 mCi
TID: 0.96

## 2024-02-05 MED ORDER — REGADENOSON 0.4 MG/5ML IV SOLN
0.4000 mg | Freq: Once | INTRAVENOUS | Status: AC
  Administered 2024-02-05: 0.4 mg via INTRAVENOUS

## 2024-02-05 MED ORDER — TECHNETIUM TC 99M TETROFOSMIN IV KIT
32.9000 | PACK | Freq: Once | INTRAVENOUS | Status: AC | PRN
  Administered 2024-02-05: 32.9 via INTRAVENOUS

## 2024-03-04 ENCOUNTER — Telehealth: Payer: Self-pay | Admitting: Nurse Practitioner

## 2024-03-04 NOTE — Telephone Encounter (Signed)
 Will look forward to receiving clearance

## 2024-03-04 NOTE — Telephone Encounter (Signed)
 New message   Patient dropped off pre op form from [facility]. Form faxed to pre op team @ [phone].

## 2024-03-05 ENCOUNTER — Telehealth: Payer: Self-pay

## 2024-03-05 NOTE — Telephone Encounter (Signed)
 Preoperative team patient underwent cardiac catheterization with stent placement 8/24.  He will not be eligible to hold Brilinta until after 08/02/2024.  Please contact requesting office and let them know that his procedure will need to be postponed.  Thank you for your help.  Please have them resubmit cardiac clearance request after anniversary of catheterization and stent placement.  Glenn Weber. Kayley Zeiders NP-C     03/05/2024, 8:56 AM [facility] [address] Office [phone] Fax [phone]

## 2024-03-05 NOTE — Telephone Encounter (Signed)
 I will fax notes to requesting office per preop APP Edd Fabian, FNP. See notes pt to be post pone, new request will need to be faxed to our office when ready to reschedule.

## 2024-03-05 NOTE — Telephone Encounter (Signed)
   Pre-operative Risk Assessment    Patient Name: Glenn Weber  DOB: 04-Nov-1981 MRN: [id number]   Date of last office visit: 01/23/24 [name] MD Date of next office visit: NONE   Request for Surgical Clearance    Procedure:   EGD/ COLONOSCOPY  Date of Surgery:  Clearance TBD                                Surgeon:  NONE LISTED Surgeon's Group or Practice Name:  [facility] Phone number:  [phone] Fax number:  [phone]   Type of Clearance Requested:   - Medical  - Pharmacy:  Hold Ticagrelor (Brilinta)     Type of Anesthesia:   DEEP SED   Additional requests/questions:    SignedMarlow Baars   03/05/2024, 8:42 AM

## 2024-04-13 ENCOUNTER — Other Ambulatory Visit: Payer: Self-pay

## 2024-04-13 ENCOUNTER — Encounter (HOSPITAL_BASED_OUTPATIENT_CLINIC_OR_DEPARTMENT_OTHER): Payer: Self-pay

## 2024-04-13 ENCOUNTER — Emergency Department (HOSPITAL_BASED_OUTPATIENT_CLINIC_OR_DEPARTMENT_OTHER)
Admission: EM | Admit: 2024-04-13 | Discharge: 2024-04-13 | Discharge status: Against Medical Advice | Attending: Emergency Medicine | Admitting: Emergency Medicine

## 2024-04-13 DIAGNOSIS — Z7902 Long term (current) use of antithrombotics/antiplatelets: Secondary | ICD-10-CM | POA: Diagnosis not present

## 2024-04-13 DIAGNOSIS — R040 Epistaxis: Secondary | ICD-10-CM | POA: Diagnosis present

## 2024-04-13 DIAGNOSIS — Z5321 Procedure and treatment not carried out due to patient leaving prior to being seen by health care provider: Secondary | ICD-10-CM | POA: Insufficient documentation

## 2024-04-13 NOTE — ED Triage Notes (Signed)
 Patient presents with epistaxis that started last night. On blood thinners. Bleeding controlled at this time.

## 2024-04-13 NOTE — ED Notes (Signed)
 Per nurse secretary, pt notified that he was leaving. Pt seen leaving department by NS. NS notified this Water engineer.

## 2024-05-13 ENCOUNTER — Other Ambulatory Visit: Payer: Self-pay | Admitting: Nurse Practitioner

## 2024-08-12 ENCOUNTER — Telehealth: Payer: Self-pay

## 2024-08-12 NOTE — Telephone Encounter (Addendum)
  Patient Consent for Virtual Visit         Glenn Weber has provided verbal consent on 08/12/2024 for a virtual visit (video or telephone).  Appointment scheduled for tomorrow, Wednesday, August 13, 2024 @ 4:20 Call patient at [phone] and/or [phone] (wife #). Med req and consent are complete.   Procedure moved to 08/20/2024 and patient is not on Eliquis, just Brilinta .   CONSENT FOR VIRTUAL VISIT FOR:  Glenn Weber  By participating in this virtual visit I agree to the following:  I hereby voluntarily request, consent and authorize [facility] and its employed or contracted physicians, physician assistants, nurse practitioners or other licensed health care professionals (the Practitioner), to provide me with telemedicine health care services (the "Services) as deemed necessary by the treating Practitioner. I acknowledge and consent to receive the Services by the Practitioner via telemedicine. I understand that the telemedicine visit will involve communicating with the Practitioner through live audiovisual communication technology and the disclosure of certain medical information by electronic transmission. I acknowledge that I have been given the opportunity to request an in-person assessment or other available alternative prior to the telemedicine visit and am voluntarily participating in the telemedicine visit.  I understand that I have the right to withhold or withdraw my consent to the use of telemedicine in the course of my care at any time, without affecting my right to future care or treatment, and that the Practitioner or I may terminate the telemedicine visit at any time. I understand that I have the right to inspect all information obtained and/or recorded in the course of the telemedicine visit and may receive copies of available information for a reasonable fee.  I understand that some of the potential risks of receiving the Services via telemedicine include:   Delay or interruption in medical evaluation due to technological equipment failure or disruption; Information transmitted may not be sufficient (e.g. poor resolution of images) to allow for appropriate medical decision making by the Practitioner; and/or  In rare instances, security protocols could fail, causing a breach of personal health information.  Furthermore, I acknowledge that it is my responsibility to provide information about my medical history, conditions and care that is complete and accurate to the best of my ability. I acknowledge that Practitioner's advice, recommendations, and/or decision may be based on factors not within their control, such as incomplete or inaccurate data provided by me or distortions of diagnostic images or specimens that may result from electronic transmissions. I understand that the practice of medicine is not an exact science and that Practitioner makes no warranties or guarantees regarding treatment outcomes. I acknowledge that a copy of this consent can be made available to me via my patient portal [facility] MyChart), or I can request a printed copy by calling the office of [facility].    I understand that my insurance will be billed for this visit.   I have read or had this consent read to me. I understand the contents of this consent, which adequately explains the benefits and risks of the Services being provided via telemedicine.  I have been provided ample opportunity to ask questions regarding this consent and the Services and have had my questions answered to my satisfaction. I give my informed consent for the services to be provided through the use of telemedicine in my medical care

## 2024-08-12 NOTE — Telephone Encounter (Signed)
   Pre-operative Risk Assessment    Patient Name: LUCAS WINOGRAD  DOB: 08-21-1981 MRN: [id number]   Date of last office visit: 01/23/2024 Date of next office visit: NA   Request for Surgical Clearance    Procedure:  Colonoscopy  Date of Surgery:  Clearance 08/15/24                                 Surgeon:  Dr. Paulita Schirmer Surgeon's Group or Practice Name:  [facility] Phone number:  [phone] Fax number:  [phone]   Type of Clearance Requested:   - Medical  - Pharmacy:  Hold Ticagrelor  (Brilinta ) and Apixaban (Eliquis) not indicated   Type of Anesthesia:  MAC   Additional requests/questions:    Bonney Ted Muskrat   08/12/2024, 2:30 PM

## 2024-08-12 NOTE — Telephone Encounter (Signed)
 Hold time for Brilinta  would not be appropriate for patient to have colonoscopy on 08/15/24. We will need to conduct a telephone assessment to ensure no concerning cardiac symptoms.   Please notify requesting provider.   I do not have record that patient is on Eliquis.   Rosaline EMERSON Bane, NP-C  08/12/2024, 2:49 PM [address] [place], [place] [address] Office [phone] Fax [phone]

## 2024-08-12 NOTE — Telephone Encounter (Signed)
 I tried contacting [facility] and did not get answer. I called the patient and confirmed medications. Patient stated that he is not currently taking Eliquis, and has not taken the Brilinta  today, last dose was yesterday. He will currently calling their office. Do I need to schedule a telehealth appointment? Please advise.

## 2024-08-13 ENCOUNTER — Ambulatory Visit: Attending: Cardiovascular Disease

## 2024-08-13 DIAGNOSIS — Z01810 Encounter for preprocedural cardiovascular examination: Secondary | ICD-10-CM | POA: Diagnosis present

## 2024-08-13 NOTE — Progress Notes (Signed)
 Virtual Visit via Telephone Note   Because of Glenn Weber co-morbid illnesses, he is at least at moderate risk for complications without adequate follow up.  This format is felt to be most appropriate for this patient at this time.  Due to technical limitations with video connection (technology), today's appointment will be conducted as an audio only telehealth visit, and Glenn Weber verbally agreed to proceed in this manner.   All issues noted in this document were discussed and addressed.  No physical exam could be performed with this format.  Evaluation Performed:  Preoperative cardiovascular risk assessment _____________   Date:  08/13/2024   Patient ID:  Glenn Weber, Glenn Weber 1981/07/16, MRN [id number] Patient Location:  Home Provider location:   Office  Primary Care Provider:  [facility] Primary Cardiologist:  Alvan Ronal BRAVO, MD (Inactive)  Chief Complaint / Patient Profile   43 y.o. y/o male with a h/o CAD s/p NSTEMI 07/2023 with three-vessel disease with 95% LCx treated with PCI/DES x 1, 75% proximal RCA, 85% second diagonal, 65% mid LAD, HLD, HTN, tobacco abuse  who is pending colonoscopy and presents today for telephonic preoperative cardiovascular risk assessment.  History of Present Illness    Glenn Weber is a 43 y.o. male who presents via audio/video conferencing for a telehealth visit today.  Pt was last seen in cardiology clinic on 01/23/2024 by Dr. Ronal Alvan.  At that time Glenn Weber was doing well but noted some sharp chest pain that occurred sporadically. He completed further evaluation with a Lexiscan  Myoview  that was normal.  The patient is now pending procedure as outlined above. Since his last visit, he reports no recurrence of chest pain or angina since his previous follow-up.  He does note that his blood thinner was switched from Brilinta  to possibly Plavix.  He  denies chest pain, shortness of breath, lower extremity edema, fatigue,  palpitations, melena, hematuria, hemoptysis, diaphoresis, weakness, presyncope, syncope, orthopnea, and PND.   Past Medical History    Past Medical History:  Diagnosis Date   Hypertension    Past Surgical History:  Procedure Laterality Date   CORONARY STENT INTERVENTION N/A 08/03/2023   Procedure: CORONARY STENT INTERVENTION;  Surgeon: Swaziland, Peter M, MD;  Location: [facility] INVASIVE CV LAB;  Service: Cardiovascular;  Laterality: N/A;   LEFT HEART CATH AND CORONARY ANGIOGRAPHY N/A 08/03/2023   Procedure: LEFT HEART CATH AND CORONARY ANGIOGRAPHY;  Surgeon: Swaziland, Peter M, MD;  Location: [facility] INVASIVE CV LAB;  Service: Cardiovascular;  Laterality: N/A;    Allergies  No Known Allergies  Home Medications    Prior to Admission medications   Medication Sig Start Date End Date Taking? Authorizing Provider  amLODipine  (NORVASC ) 10 MG tablet TAKE ONE TABLET BY MOUTH ONE TIME DAILY 05/13/24   Wyn Jackee VEAR Mickey., NP  aspirin  EC 81 MG tablet Take 1 tablet (81 mg total) by mouth daily. Swallow whole. 08/05/23   Vicci Rollo SAUNDERS, PA-C  atorvastatin  (LIPITOR) 80 MG tablet Take 1 tablet (80 mg total) by mouth daily. 09/21/23   Wyn Jackee VEAR Mickey., NP  Ferrous Sulfate Dried (FERROUS SULFATE IRON PO) Take 1 tablet by mouth daily.    [provider]  losartan  (COZAAR ) 100 MG tablet Take 1 tablet (100 mg total) by mouth daily. 09/21/23   Wyn Jackee VEAR Mickey., NP  metoprolol  succinate (TOPROL  XL) 50 MG 24 hr tablet Take 1 tablet (50 mg total) by mouth daily. Take with or immediately  following a meal. 01/23/24   Alvan Ronal BRAVO, MD  Multiple Vitamin (MULTIVITAMIN) tablet Take 1 tablet by mouth daily.    [provider]  nitroGLYCERIN  (NITROSTAT ) 0.4 MG SL tablet Place 1 tablet (0.4 mg total) under the tongue every 5 (five) minutes as needed for chest pain. DO NOT take within 24 hours of taking medications for erectile dysfunction 09/21/23 09/20/24  Wyn Jackee VEAR Mickey., NP  tadalafil (CIALIS) 5 MG tablet  Take 5 mg by mouth daily as needed for erectile dysfunction.    [provider]  ticagrelor  (BRILINTA ) 90 MG TABS tablet Take 1 tablet (90 mg total) by mouth 2 (two) times daily. 09/21/23   Wyn Jackee VEAR Mickey., NP    Physical Exam    Vital Signs:  Glenn Weber does not have vital signs available for review today.  Given telephonic nature of communication, physical exam is limited. AAOx3. NAD. Normal affect.  Speech and respirations are unlabored.  Accessory Clinical Findings    None  Assessment & Plan    1.  Preoperative Cardiovascular Risk Assessment: - Patient's RCRI score is 0.9%  The patient affirms he has been doing well without any new cardiac symptoms. They are able to achieve 7 METS without cardiac limitations. Therefore, based on ACC/AHA guidelines, the patient would be at acceptable risk for the planned procedure without further cardiovascular testing. The patient was advised that if he develops new symptoms prior to surgery to contact our office to arrange for a follow-up visit, and he verbalized understanding.   The patient was advised that if he develops new symptoms prior to surgery to contact our office to arrange for a follow-up visit, and he verbalized understanding.  Patient advised to hold Brilinta  5 days prior to procedure and should restart postprocedure when surgically safe and hemostasis is achieved.  (Reminder: Include SBE prophylaxis/Antiplatelet/Anticoag Instructions)  A copy of this note will be routed to requesting surgeon.  Time:   Today, I have spent 7 minutes with the patient with telehealth technology discussing medical history, symptoms, and management plan.     Wyn Mickey, Jackee Shove, NP  08/13/2024, 8:16 AM

## 2024-08-14 NOTE — Telephone Encounter (Signed)
 Pt is calling to say he has stopped taking his Brilinta  for the last 3 days in preparation for the procedure and would like a c/b when has been cleared for his procedure.

## 2024-08-14 NOTE — Telephone Encounter (Signed)
 Patient contacted per instructions from yesterday's clearance visit.  He reports that he has been off of Brilinta  for the past 3 days and was advised to continue to hold and resume after his upcoming procedure.  He also verify that he is no longer taking Eliquis and will be removed from any chart to decrease risk of confusion.  Please see preop clearance televisit note for further explanation.  [name], NP

## 2024-08-14 NOTE — Telephone Encounter (Signed)
**Note De-identified  [place] Obfuscation** Please advise 

## 2024-09-30 ENCOUNTER — Other Ambulatory Visit: Payer: Self-pay | Admitting: Cardiology

## 2024-10-13 ENCOUNTER — Other Ambulatory Visit: Payer: Self-pay | Admitting: Nurse Practitioner

## 2024-12-31 ENCOUNTER — Encounter (HOSPITAL_BASED_OUTPATIENT_CLINIC_OR_DEPARTMENT_OTHER): Payer: Self-pay

## 2024-12-31 ENCOUNTER — Other Ambulatory Visit: Payer: Self-pay

## 2024-12-31 ENCOUNTER — Inpatient Hospital Stay (HOSPITAL_COMMUNITY)

## 2024-12-31 ENCOUNTER — Observation Stay (HOSPITAL_BASED_OUTPATIENT_CLINIC_OR_DEPARTMENT_OTHER)
Admission: EM | Admit: 2024-12-31 | Discharge: 2025-01-01 | Disposition: A | Discharge status: Home / Self Care | Attending: Internal Medicine | Admitting: Internal Medicine

## 2024-12-31 DIAGNOSIS — R079 Chest pain, unspecified: Secondary | ICD-10-CM | POA: Insufficient documentation

## 2024-12-31 DIAGNOSIS — K219 Gastro-esophageal reflux disease without esophagitis: Secondary | ICD-10-CM | POA: Insufficient documentation

## 2024-12-31 DIAGNOSIS — R0902 Hypoxemia: Secondary | ICD-10-CM | POA: Diagnosis not present

## 2024-12-31 DIAGNOSIS — E785 Hyperlipidemia, unspecified: Secondary | ICD-10-CM | POA: Diagnosis not present

## 2024-12-31 DIAGNOSIS — R220 Localized swelling, mass and lump, head: Secondary | ICD-10-CM | POA: Diagnosis not present

## 2024-12-31 DIAGNOSIS — Z79899 Other long term (current) drug therapy: Secondary | ICD-10-CM | POA: Insufficient documentation

## 2024-12-31 DIAGNOSIS — Z7982 Long term (current) use of aspirin: Secondary | ICD-10-CM | POA: Diagnosis not present

## 2024-12-31 DIAGNOSIS — F17210 Nicotine dependence, cigarettes, uncomplicated: Secondary | ICD-10-CM | POA: Insufficient documentation

## 2024-12-31 DIAGNOSIS — I2510 Atherosclerotic heart disease of native coronary artery without angina pectoris: Secondary | ICD-10-CM | POA: Insufficient documentation

## 2024-12-31 DIAGNOSIS — I10 Essential (primary) hypertension: Secondary | ICD-10-CM | POA: Diagnosis not present

## 2024-12-31 DIAGNOSIS — Z743 Need for continuous supervision: Secondary | ICD-10-CM | POA: Diagnosis not present

## 2024-12-31 DIAGNOSIS — T783XXA Angioneurotic edema, initial encounter: Principal | ICD-10-CM | POA: Insufficient documentation

## 2024-12-31 LAB — COMPREHENSIVE METABOLIC PANEL WITH GFR
ALT: 31 U/L (ref 0–44)
AST: 100 U/L — ABNORMAL HIGH (ref 15–41)
Albumin: 4.4 g/dL (ref 3.5–5.0)
Alkaline Phosphatase: 92 U/L (ref 38–126)
Anion gap: 12 (ref 5–15)
BUN: 20 mg/dL (ref 6–20)
CO2: 22 mmol/L (ref 22–32)
Calcium: 9.1 mg/dL (ref 8.9–10.3)
Chloride: 103 mmol/L (ref 98–111)
Creatinine, Ser: 1.37 mg/dL — ABNORMAL HIGH (ref 0.61–1.24)
GFR, Estimated: >60 mL/min
Glucose, Bld: 215 mg/dL — ABNORMAL HIGH (ref 70–99)
Potassium: 4.7 mmol/L (ref 3.5–5.1)
Sodium: 136 mmol/L (ref 135–145)
Total Bilirubin: 0.4 mg/dL (ref 0.0–1.2)
Total Protein: 7.2 g/dL (ref 6.5–8.1)

## 2024-12-31 LAB — CBC WITH DIFFERENTIAL/PLATELET
Abs Immature Granulocytes: 0.02 K/uL (ref 0.00–0.07)
Basophils Absolute: 0 K/uL (ref 0.0–0.1)
Basophils Relative: 0 %
Eosinophils Absolute: 0.2 K/uL (ref 0.0–0.5)
Eosinophils Relative: 2 %
HCT: 40.8 % (ref 39.0–52.0)
Hemoglobin: 14.3 g/dL (ref 13.0–17.0)
Immature Granulocytes: 0 %
Lymphocytes Relative: 38 %
Lymphs Abs: 3.2 K/uL (ref 0.7–4.0)
MCH: 31.5 pg (ref 26.0–34.0)
MCHC: 35 g/dL (ref 30.0–36.0)
MCV: 89.9 fL (ref 80.0–100.0)
Monocytes Absolute: 0.7 K/uL (ref 0.1–1.0)
Monocytes Relative: 9 %
Neutro Abs: 4.1 K/uL (ref 1.7–7.7)
Neutrophils Relative %: 51 %
Platelets: 291 K/uL (ref 150–400)
RBC: 4.54 MIL/uL (ref 4.22–5.81)
RDW: 13.8 % (ref 11.5–15.5)
WBC: 8.3 K/uL (ref 4.0–10.5)
nRBC: 0 % (ref 0.0–0.2)

## 2024-12-31 LAB — ECHOCARDIOGRAM COMPLETE
AR max vel: 2.31 cm2
AV Area VTI: 2.37 cm2
AV Area mean vel: 2.28 cm2
AV Mean grad: 4 mmHg
AV Peak grad: 7.7 mmHg
Ao pk vel: 1.39 m/s
Area-P 1/2: 3.77 cm2
MV M vel: 1.33 m/s
MV Peak grad: 7.1 mmHg
S' Lateral: 2.6 cm

## 2024-12-31 LAB — TSH: TSH: 0.406 u[IU]/mL (ref 0.350–4.500)

## 2024-12-31 LAB — LIPID PANEL
Cholesterol: 97 mg/dL (ref 0–200)
HDL: 52 mg/dL
LDL Cholesterol: 26 mg/dL (ref 0–99)
Total CHOL/HDL Ratio: 1.9 ratio
Triglycerides: 92 mg/dL
VLDL: 18 mg/dL (ref 0–40)

## 2024-12-31 LAB — BASIC METABOLIC PANEL WITH GFR
Anion gap: 13 (ref 5–15)
BUN: 20 mg/dL (ref 6–20)
CO2: 22 mmol/L (ref 22–32)
Calcium: 9 mg/dL (ref 8.9–10.3)
Chloride: 105 mmol/L (ref 98–111)
Creatinine, Ser: 1.4 mg/dL — ABNORMAL HIGH (ref 0.61–1.24)
GFR, Estimated: >60 mL/min
Glucose, Bld: 129 mg/dL — ABNORMAL HIGH (ref 70–99)
Potassium: 4 mmol/L (ref 3.5–5.1)
Sodium: 140 mmol/L (ref 135–145)

## 2024-12-31 LAB — MAGNESIUM: Magnesium: 1.9 mg/dL (ref 1.7–2.4)

## 2024-12-31 LAB — HIV ANTIBODY (ROUTINE TESTING W REFLEX): HIV Screen 4th Generation wRfx: NONREACTIVE

## 2024-12-31 LAB — TROPONIN T, HIGH SENSITIVITY
Troponin T High Sensitivity: <15 ng/L (ref 0–19)
Troponin T High Sensitivity: <15 ng/L (ref 0–19)

## 2024-12-31 MED ORDER — DIPHENHYDRAMINE HCL 25 MG PO CAPS
25.0000 mg | ORAL_CAPSULE | Freq: Three times a day (TID) | ORAL | Status: DC
  Administered 2024-12-31 – 2025-01-01 (×4): 25 mg via ORAL
  Filled 2024-12-31 (×5): qty 1

## 2024-12-31 MED ORDER — PERFLUTREN LIPID MICROSPHERE
1.0000 mL | INTRAVENOUS | Status: AC | PRN
  Administered 2024-12-31: 7 mL via INTRAVENOUS

## 2024-12-31 MED ORDER — HYDRALAZINE HCL 25 MG PO TABS
25.0000 mg | ORAL_TABLET | Freq: Three times a day (TID) | ORAL | Status: DC
  Administered 2024-12-31 – 2025-01-01 (×4): 25 mg via ORAL
  Filled 2024-12-31 (×4): qty 1

## 2024-12-31 MED ORDER — LACTATED RINGERS IV SOLN: INTRAVENOUS | Status: AC

## 2024-12-31 MED ORDER — ASPIRIN 81 MG PO TBEC
81.0000 mg | DELAYED_RELEASE_TABLET | Freq: Every day | ORAL | Status: DC
  Administered 2024-12-31 – 2025-01-01 (×2): 81 mg via ORAL
  Filled 2024-12-31 (×2): qty 1

## 2024-12-31 MED ORDER — TRANEXAMIC ACID-NACL 1000-0.7 MG/100ML-% IV SOLN: 1000.0000 mg | INTRAVENOUS | Status: AC

## 2024-12-31 MED ORDER — FAMOTIDINE IN NACL 20-0.9 MG/50ML-% IV SOLN
20.0000 mg | Freq: Two times a day (BID) | INTRAVENOUS | Status: DC
  Administered 2024-12-31 (×2): 20 mg via INTRAVENOUS
  Filled 2024-12-31 (×3): qty 50

## 2024-12-31 MED ORDER — FAMOTIDINE IN NACL 20-0.9 MG/50ML-% IV SOLN
20.0000 mg | Freq: Once | INTRAVENOUS | Status: AC
  Administered 2024-12-31: 20 mg via INTRAVENOUS
  Filled 2024-12-31: qty 50

## 2024-12-31 MED ORDER — ADULT MULTIVITAMIN W/MINERALS CH
1.0000 | ORAL_TABLET | Freq: Every day | ORAL | Status: DC
  Administered 2024-12-31 – 2025-01-01 (×2): 1 via ORAL
  Filled 2024-12-31 (×2): qty 1

## 2024-12-31 MED ORDER — SUCRALFATE 1 GM/10ML PO SUSP
1.0000 g | Freq: Three times a day (TID) | ORAL | Status: DC
  Administered 2024-12-31 (×2): 1 g via ORAL
  Filled 2024-12-31 (×3): qty 10

## 2024-12-31 MED ORDER — DIPHENHYDRAMINE HCL 50 MG/ML IJ SOLN: 25.0000 mg | Freq: Four times a day (QID) | INTRAMUSCULAR | Status: DC | PRN

## 2024-12-31 MED ORDER — AMLODIPINE BESYLATE 5 MG PO TABS
10.0000 mg | ORAL_TABLET | Freq: Every day | ORAL | Status: DC
  Administered 2024-12-31 – 2025-01-01 (×2): 10 mg via ORAL
  Filled 2024-12-31 (×2): qty 2

## 2024-12-31 MED ORDER — METOPROLOL SUCCINATE ER 25 MG PO TB24
50.0000 mg | ORAL_TABLET | Freq: Every day | ORAL | Status: DC
  Administered 2024-12-31 – 2025-01-01 (×2): 50 mg via ORAL
  Filled 2024-12-31 (×2): qty 2

## 2024-12-31 MED ORDER — HEPARIN SODIUM (PORCINE) 5000 UNIT/ML IJ SOLN
5000.0000 [IU] | Freq: Three times a day (TID) | INTRAMUSCULAR | Status: DC
  Administered 2024-12-31 – 2025-01-01 (×4): 5000 [IU] via SUBCUTANEOUS
  Filled 2024-12-31 (×5): qty 1

## 2024-12-31 MED ORDER — DIPHENHYDRAMINE HCL 25 MG PO CAPS: 25.0000 mg | ORAL_CAPSULE | Freq: Three times a day (TID) | ORAL | Status: DC

## 2024-12-31 MED ORDER — ONDANSETRON HCL 4 MG/2ML IJ SOLN: 4.0000 mg | Freq: Four times a day (QID) | INTRAMUSCULAR | Status: DC | PRN

## 2024-12-31 MED ORDER — NITROGLYCERIN 0.4 MG SL SUBL: 0.4000 mg | SUBLINGUAL_TABLET | SUBLINGUAL | Status: DC | PRN

## 2024-12-31 MED ORDER — MORPHINE SULFATE (PF) 2 MG/ML IV SOLN
2.0000 mg | INTRAVENOUS | Status: DC | PRN
  Administered 2024-12-31 – 2025-01-01 (×3): 2 mg via INTRAVENOUS
  Filled 2024-12-31 (×3): qty 1

## 2024-12-31 MED ORDER — METHYLPREDNISOLONE SODIUM SUCC 125 MG IJ SOLR
60.0000 mg | Freq: Two times a day (BID) | INTRAMUSCULAR | Status: DC
  Administered 2024-12-31 (×2): 60 mg via INTRAVENOUS
  Filled 2024-12-31 (×3): qty 2

## 2024-12-31 MED ORDER — DIPHENHYDRAMINE HCL 50 MG/ML IJ SOLN
50.0000 mg | Freq: Once | INTRAMUSCULAR | Status: AC
  Administered 2024-12-31: 50 mg via INTRAVENOUS
  Filled 2024-12-31: qty 1

## 2024-12-31 MED ORDER — ALUM & MAG HYDROXIDE-SIMETH 200-200-20 MG/5ML PO SUSP
15.0000 mL | Freq: Once | ORAL | Status: AC
  Administered 2024-12-31: 15 mL via ORAL
  Filled 2024-12-31: qty 30

## 2024-12-31 MED ORDER — ATORVASTATIN CALCIUM 40 MG PO TABS
80.0000 mg | ORAL_TABLET | Freq: Every day | ORAL | Status: DC
  Administered 2024-12-31 – 2025-01-01 (×2): 80 mg via ORAL
  Filled 2024-12-31 (×2): qty 2

## 2024-12-31 MED ORDER — PANTOPRAZOLE SODIUM 40 MG PO TBEC
40.0000 mg | DELAYED_RELEASE_TABLET | Freq: Every day | ORAL | Status: DC
  Administered 2024-12-31 – 2025-01-01 (×2): 40 mg via ORAL
  Filled 2024-12-31 (×2): qty 1

## 2024-12-31 MED ORDER — ONDANSETRON HCL 4 MG PO TABS: 4.0000 mg | ORAL_TABLET | Freq: Four times a day (QID) | ORAL | Status: DC | PRN

## 2024-12-31 MED ORDER — ACETAMINOPHEN 325 MG PO TABS: 650.0000 mg | ORAL_TABLET | Freq: Four times a day (QID) | ORAL | Status: DC | PRN

## 2024-12-31 MED ORDER — ACETAMINOPHEN 650 MG RE SUPP: 650.0000 mg | Freq: Four times a day (QID) | RECTAL | Status: DC | PRN

## 2024-12-31 MED ORDER — METHYLPREDNISOLONE SODIUM SUCC 125 MG IJ SOLR
125.0000 mg | INTRAMUSCULAR | Status: AC
  Administered 2024-12-31: 125 mg via INTRAVENOUS
  Filled 2024-12-31: qty 2

## 2024-12-31 MED ORDER — EPINEPHRINE 0.3 MG/0.3ML IJ SOAJ
0.3000 mg | Freq: Once | INTRAMUSCULAR | Status: AC
  Administered 2024-12-31: 0.3 mg via INTRAMUSCULAR
  Filled 2024-12-31: qty 0.3

## 2024-12-31 MED ORDER — TRANEXAMIC ACID-NACL 1000-0.7 MG/100ML-% IV SOLN
INTRAVENOUS | Status: AC
  Administered 2024-12-31: 1000 mg via INTRAVENOUS
  Filled 2024-12-31: qty 100

## 2024-12-31 MED ORDER — TICAGRELOR 90 MG PO TABS
90.0000 mg | ORAL_TABLET | Freq: Two times a day (BID) | ORAL | Status: DC
  Administered 2024-12-31 – 2025-01-01 (×3): 90 mg via ORAL
  Filled 2024-12-31 (×3): qty 1

## 2024-12-31 NOTE — Progress Notes (Signed)
 Brief same day note: Patient is a 44 year old male with history of hypertension, hyperlipidemia, GERD, coronary artery disease status post PCI with DES presented with facial swelling, tongue swelling, itching.  Benadryl  did not help.  Takes losartan  at home.  On prednisone , he was mildly hypertensive otherwise hemodynamically stable.  Angioedema was suspected and he was given Solu-Medrol , epi, Benadryl , Pepcid .  Patient seen and examined at bedside today.  During my evaluation, he was comfortable.  Tongue swelling is improving.  His lungs are clear to auscultation.  We discussed about continuing current management and possible discharge home tomorrow.  He should not be continuing losartan  from now.  I agree with assessment and plan from Dr. Debby who admitted him this morning

## 2024-12-31 NOTE — Consult Note (Signed)
 "  NAME:  Glenn Weber, MRN:  [id number], DOB:  1981/02/04, LOS: 0 ADMISSION DATE:  12/31/2024, CONSULTATION DATE: 1/14 REFERRING MD: Dr. Jerral, CHIEF COMPLAINT: Oral edema  History of Present Illness:  44 year old male with past medical history as below, which is significant for hypertension who presented to the emergency department at [facility] with complaints of tongue swelling.  He was in his usual state of health until 1/14 at about 2 AM when he woke up with itching and urticaria.  He and his fiance tried to manage this at home with Benadryl  and topical treatment without improvement.  When he began to feel some tongue swelling they promptly reported to the emergency department.  Patient is sleeping and very tired so history taken from the patient himself with assistance of his fiance.  She reports shrimp and steak or eaten for dinner as well as some nuts eaten around that same time 11 PM.  He has not been allergic to any of these items in the past.  Only other new food was perhaps avocados within the last 24 hours.  Otherwise he has been on losartan  for years.   In the emergency department at [facility] he was given epinephrine  x 2 with minimal benefit, however after treatment with TXA he did improved.  Family and nursing staff report significant improvement in the oral edema.  PCCM is asked to evaluate for admission in the setting of airway concerns.  Pertinent  Medical History   has a past medical history of Hypertension.   Significant Hospital Events: Including procedures, antibiotic start and stop dates in addition to other pertinent events     Interim History / Subjective:    Objective    Blood pressure (!) 134/102, pulse 71, temperature 98.5 F (36.9 C), temperature source Oral, resp. rate 16, SpO2 100%.       No intake or output data in the 24 hours ending 12/31/24 0628 There were no vitals filed for this visit.  Examination: General: Middle-age male in no acute  distress HENT: Normocephalic, atraumatic.  Tongue does appear to be mildly swollen.  No stridor.  Phonating well.  Able to swallow oral secretions without difficulty. Lungs: Clear bilateral breath sounds, oxygen saturations are 100% on room air Cardiovascular: Regular rate and rhythm, no MRG Abdomen: Soft, nontender, nondistended Extremities: No acute deformity or edema Neuro: Somnolent, easily arouses and cooperates appropriately with exam.  Fully oriented  Resolved problem list   Assessment and Plan    Anaphylaxis versus angioedema: Difficult to say for sure.  Onset of symptoms approximately 3 hours after eating shrimp and cashews and nearly 24 hours after eating avocados which are new food.  He has been on losartan  for years.  The presence of the hives makes me feel like this is less likely an isolated angioedema from ARB.  But it does sound like the most relief was achieved with TXA administration.  Not really clear-cut. fortunately he has responded to treatment in the ED including Pepcid , EpiPen  x 2, Solu-Medrol , Benadryl , and tranexamic acid .  His oral swelling has decreased significantly.  He is breathing comfortably.  There is no stridor, he phonates well, and is able to manage his oral secretions without difficulty. - Recommend admission to progressive care unit - PCCM will be available for transfer to the ICU if symptoms worsen - Treatment of above per primary - Would discontinue losartan  - Likely would benefit from outpatient allergy workup  Labs   CBC: Recent Labs  Lab 12/31/24  0341  WBC 8.3  NEUTROABS 4.1  HGB 14.3  HCT 40.8  MCV 89.9  PLT 291    Basic Metabolic Panel: Recent Labs  Lab 12/31/24 0341  NA 140  K 4.0  CL 105  CO2 22  GLUCOSE 129*  BUN 20  CREATININE 1.40*  CALCIUM  9.0   GFR: CrCl cannot be calculated (Unknown ideal weight.). Recent Labs  Lab 12/31/24 0341  WBC 8.3    Liver Function Tests: No results for input(s): AST, ALT,  ALKPHOS, BILITOT, PROT, ALBUMIN in the last 168 hours. No results for input(s): LIPASE, AMYLASE in the last 168 hours. No results for input(s): AMMONIA in the last 168 hours.  ABG No results found for: PHART, PCO2ART, PO2ART, HCO3, TCO2, ACIDBASEDEF, O2SAT   Coagulation Profile: No results for input(s): INR, PROTIME in the last 168 hours.  Cardiac Enzymes: No results for input(s): CKTOTAL, CKMB, CKMBINDEX, TROPONINI in the last 168 hours.  HbA1C: Hgb A1c MFr Bld  Date/Time Value Ref Range Status  08/02/2023 11:53 PM 5.9 (H) 4.8 - 5.6 % Final    Comment:    (NOTE) Pre diabetes:          5.7%-6.4%  Diabetes:              >6.4%  Glycemic control for   <7.0% adults with diabetes     CBG: No results for input(s): GLUCAP in the last 168 hours.  Review of Systems:    Bolds are positive  Constitutional: weight loss, gain, night sweats, Fevers, chills, fatigue .  HEENT: headaches, Sore throat, sneezing, nasal congestion, post nasal drip, Difficulty swallowing, Tooth/dental problems, visual complaints visual changes, ear ache CV:  chest pain, radiates:,Orthopnea, PND, swelling in lower extremities, dizziness, palpitations, syncope.  GI  heartburn, indigestion, abdominal pain, nausea, vomiting, diarrhea, change in bowel habits, loss of appetite, bloody stools.  Resp: cough, productive: , hemoptysis, dyspnea, chest pain, pleuritic.  Skin: rash or itching or icterus GU: dysuria, change in color of urine, urgency or frequency. flank pain, hematuria  MS: joint pain or swelling. decreased range of motion  Psych: change in mood or affect. depression or anxiety.  Neuro: difficulty with speech, weakness, numbness, ataxia    Past Medical History:  He,  has a past medical history of Hypertension.   Surgical History:   Past Surgical History:  Procedure Laterality Date   CORONARY STENT INTERVENTION N/A 08/03/2023   Procedure: CORONARY STENT  INTERVENTION;  Surgeon: Jordan, Peter M, MD;  Location: [facility] INVASIVE CV LAB;  Service: Cardiovascular;  Laterality: N/A;   LEFT HEART CATH AND CORONARY ANGIOGRAPHY N/A 08/03/2023   Procedure: LEFT HEART CATH AND CORONARY ANGIOGRAPHY;  Surgeon: Jordan, Peter M, MD;  Location: [facility] INVASIVE CV LAB;  Service: Cardiovascular;  Laterality: N/A;     Social History:   reports that he has been smoking cigarettes. He has never used smokeless tobacco. He reports current alcohol use. He reports current drug use. Drug: Marijuana.   Family History:  His family history is negative for Colon cancer and Esophageal cancer.   Allergies Allergies[1]   Home Medications  Prior to Admission medications  Medication Sig Start Date End Date Taking? Authorizing Provider  acetaminophen  (TYLENOL ) 500 MG tablet Take 1,000 mg by mouth every 6 (six) hours as needed for mild pain (pain score 1-3) or headache.   Yes [provider]  amLODipine  (NORVASC ) 10 MG tablet TAKE ONE TABLET BY MOUTH ONE TIME DAILY 05/13/24  Yes Dick, Ernest H Jr., NP  aspirin  EC 81 MG tablet Take 1 tablet (81 mg total) by mouth daily. Swallow whole. 08/05/23  Yes Vicci Rollo SAUNDERS, PA-C  atorvastatin  (LIPITOR) 80 MG tablet TAKE ONE TABLET BY MOUTH ONE TIME DAILY 10/14/24  Yes Goodrich, Callie E, PA-C  esomeprazole (NEXIUM) 40 MG capsule Take 40 mg by mouth daily.   Yes [provider]  Ferrous Sulfate Dried (FERROUS SULFATE IRON PO) Take 1 tablet by mouth daily.   Yes [provider]  ibuprofen  (ADVIL ) 200 MG tablet Take 400-600 mg by mouth every 6 (six) hours as needed for mild pain (pain score 1-3).   Yes [provider]  losartan  (COZAAR ) 100 MG tablet Take 1 tablet (100 mg total) by mouth daily. 09/21/23  Yes Wyn Jackee VEAR Mickey., NP  metoprolol  succinate (TOPROL  XL) 50 MG 24 hr tablet Take 1 tablet (50 mg total) by mouth daily. Take with or immediately following a meal. 01/23/24  Yes Branch, Ronal BRAVO, MD  Multiple  Vitamin (MULTIVITAMIN) tablet Take 1 tablet by mouth daily.   Yes [provider]  nitroGLYCERIN  (NITROSTAT ) 0.4 MG SL tablet Place 1 tablet (0.4 mg total) under the tongue every 5 (five) minutes as needed for chest pain. DO NOT take within 24 hours of taking medications for erectile dysfunction 09/21/23 04/16/25 Yes Wyn Jackee VEAR Mickey., NP  tadalafil (CIALIS) 5 MG tablet Take 5 mg by mouth daily as needed for erectile dysfunction.   Yes [provider]  ticagrelor  (BRILINTA ) 90 MG TABS tablet TAKE ONE TABLET BY MOUTH TWICE A DAY 10/03/24  Yes Lelon Hamilton T, PA-C           Deward Eastern, AGACNP-BC [facility]  See Amion for personal pager PCCM on call pager [phone] until 7pm. Please call Elink 7p-7a. [phone]  12/31/2024 6:44 AM             [1] No Known Allergies  "

## 2024-12-31 NOTE — ED Provider Notes (Signed)
 " Glenn Weber   CSN: [id number] Arrival date & time: 12/31/24  9748     Patient presents with: No chief complaint on file.   Glenn Weber is a 44 y.o. male.   The history is provided by the patient.  Allergic Reaction Presenting symptoms: swelling   Severity:  Severe Duration:  1 hour Prior allergic episodes:  No prior episodes Context: medications   Context comment:  Losartan  did eat cashews and shrimp earlier in the evening but has never been allergic in the past Relieved by:  Nothing Worsened by:  Nothing Ineffective treatments:  None tried Patient with HTN on Losartan  presents with tongue swelling.  Patient reports eating shrmp and cashews earlier in the evening but has never had an allergic reaction in the past.       Prior to Admission medications  Medication Sig Start Date End Date Taking? Authorizing Provider  amLODipine  (NORVASC ) 10 MG tablet TAKE ONE TABLET BY MOUTH ONE TIME DAILY 05/13/24   Wyn Jackee VEAR Mickey., NP  aspirin  EC 81 MG tablet Take 1 tablet (81 mg total) by mouth daily. Swallow whole. 08/05/23   Vicci Rollo SAUNDERS, PA-C  atorvastatin  (LIPITOR) 80 MG tablet TAKE ONE TABLET BY MOUTH ONE TIME DAILY 10/14/24   Goodrich, Callie E, PA-C  Ferrous Sulfate Dried (FERROUS SULFATE IRON PO) Take 1 tablet by mouth daily.    [provider]  losartan  (COZAAR ) 100 MG tablet Take 1 tablet (100 mg total) by mouth daily. 09/21/23   Wyn Jackee VEAR Mickey., NP  metoprolol  succinate (TOPROL  XL) 50 MG 24 hr tablet Take 1 tablet (50 mg total) by mouth daily. Take with or immediately following a meal. 01/23/24   Alvan Ronal BRAVO, MD  Multiple Vitamin (MULTIVITAMIN) tablet Take 1 tablet by mouth daily.    [provider]  nitroGLYCERIN  (NITROSTAT ) 0.4 MG SL tablet Place 1 tablet (0.4 mg total) under the tongue every 5 (five) minutes as needed for chest pain. DO NOT take within 24 hours of taking medications for  erectile dysfunction 09/21/23 09/20/24  Wyn Jackee VEAR Mickey., NP  tadalafil (CIALIS) 5 MG tablet Take 5 mg by mouth daily as needed for erectile dysfunction.    [provider]  ticagrelor  (BRILINTA ) 90 MG TABS tablet TAKE ONE TABLET BY MOUTH TWICE A DAY 10/03/24   Lelon Hamilton T, PA-C    Allergies: Patient has no known allergies.    Review of Systems  Updated Vital Signs BP (!) 144/96   Pulse 83   Temp 98.5 F (36.9 C) (Oral)   Resp 20   SpO2 97%   Physical Exam Vitals and nursing Weber reviewed. Exam conducted with a chaperone present.  Constitutional:      General: He is not in acute distress.    Appearance: Normal appearance. He is well-developed. He is not diaphoretic.  HENT:     Head: Normocephalic and atraumatic.     Nose: Nose normal.     Mouth/Throat:     Comments: Tongue swelling.  No swelling of the lips uvula or floor of the mouth Eyes:     Conjunctiva/sclera: Conjunctivae normal.     Pupils: Pupils are equal, round, and reactive to light.  Cardiovascular:     Rate and Rhythm: Normal rate and regular rhythm.     Pulses: Normal pulses.     Heart sounds: Normal heart sounds.  Pulmonary:     Effort: Pulmonary effort is normal.  Breath sounds: Normal breath sounds. No stridor. No wheezing or rales.  Abdominal:     General: Bowel sounds are normal.     Palpations: Abdomen is soft.     Tenderness: There is no abdominal tenderness. There is no guarding or rebound.  Musculoskeletal:        General: Normal range of motion.     Cervical back: Normal range of motion and neck supple.  Skin:    General: Skin is warm and dry.     Capillary Refill: Capillary refill takes less than 2 seconds.     Comments: Scattered groin urticaria  Neurological:     General: No focal deficit present.     Mental Status: He is alert and oriented to person, place, and time.     (all labs ordered are listed, but only abnormal results are displayed) Labs Reviewed  CBC WITH  DIFFERENTIAL/PLATELET  BASIC METABOLIC PANEL WITH GFR    EKG: None  Radiology: No results found.   .Critical Care  Performed by: Nettie Earing, MD Authorized by: Nettie Earing, MD   Critical care provider statement:    Critical care time (minutes):  60   Critical care end time:  12/31/2024 3:54 AM   Critical care was necessary to treat or prevent imminent or life-threatening deterioration of the following conditions: allergic reaction.   Critical care was time spent personally by me on the following activities:  Development of treatment plan with patient or surrogate, discussions with consultants, evaluation of patient's response to treatment, examination of patient, ordering and review of laboratory studies, ordering and review of radiographic studies, ordering and performing treatments and interventions, pulse oximetry, re-evaluation of patient's condition and review of old charts   I assumed direction of critical care for this patient from another provider in my specialty: no     Care discussed with: admitting provider   Comments:     Upon reassessment patient does not report improvement but tongue is stable to slight improvement.  Is maintaining secretions     Medications Ordered in the ED  diphenhydrAMINE  (BENADRYL ) injection 50 mg (50 mg Intravenous Given 12/31/24 0305)  EPINEPHrine  (EPI-PEN) injection 0.3 mg (0.3 mg Intramuscular Given 12/31/24 0304)  methylPREDNISolone  sodium succinate (SOLU-MEDROL ) 125 mg/2 mL injection 125 mg (125 mg Intravenous Given 12/31/24 0305)  famotidine  (PEPCID ) IVPB 20 mg premix (0 mg Intravenous Stopped 12/31/24 0340)  EPINEPHrine  (EPI-PEN) injection 0.3 mg (0.3 mg Intramuscular Given 12/31/24 0339)  tranexamic acid  (CYKLOKAPRON ) IVPB 1,000 mg (1,000 mg Intravenous New Bag/Given 12/31/24 9661)                                    Medical Decision Making Patient with onset tongue swelling in the last hour   Amount and/or Complexity of Data  Reviewed Independent Historian: spouse    Details: See above  External Data Reviewed: notes.    Details: Previous notes reviewed  Labs: ordered.  Risk Prescription drug management. Decision regarding hospitalization. Risk Details: 3:43 AM case d/w Dr. Luciano continue medical management.  Have ICU call if needed.  Agrees with transfer.  Please send secure chat 3:44 AM case d/w Dr. Layman of ICU 3:48 AM case d/w Dr. Jerral who agrees to accept      Final diagnoses:  Angioedema, initial encounter    Patient will go ED to ED for ICU as there are no beds at this time   ED  Discharge Orders     None          Mercer Peifer, MD 12/31/24 9641  "

## 2024-12-31 NOTE — ED Notes (Signed)
 Oral swelling and tongue swelling improved significantly.

## 2024-12-31 NOTE — ED Triage Notes (Addendum)
 Pt presents via POV c/o swollen tongue and hives starting when he woke up. Reports at nuts and shrimp at apx 2300 last night. Also reports hx of HTN on multiple meds for same. Tongue is noted to be swollen, no stridor currently. Breathing equal and unlabored at this time.

## 2024-12-31 NOTE — H&P (Addendum)
 " History and Physical    HILLARY SCHWEGLER FMW:[id number] DOB: 08-Oct-1981 DOA: 12/31/2024  [facility]P: [facility]  Patient coming from: [facility]  I have personally briefly reviewed patient's old medical records in [facility]  Chief Complaint: facial swelling/throat swelling concern for allergy vs angioedema   HPI: Glenn Weber is a 44 y.o. male with medical history significant of  Hypertension, HLD, GERD ,CAD s/p MI s/p DES 09/2023,who presents to ED at [facility] with complaint of facial swelling as well as tongue swelling. He notes symptoms began around 2 am when he woke up with itching and urticaria. He took Benadryl  w/o improvement. However he got concerned when he noted tongue swelling and presented to ED. He notes no prior history of any allergies. He notes no new medications and has as been on losartan  for many years. He also has never has reaction to peanuts. He notes he has peanut snack an hour before bed.  Patient noted he currently feels improved s/p treatment in ED. He notes however still has some swelling of tongue. Patient also currently has complaint of sharp chest pain he notes across his chest. He notes associated sob or nausea.  ED Course:  Afeb, bp 144/96, hr 83, rr 20, sat 97%  Labs:  Wbc 8.3, hgb 14.3, plt 291,  Na 140, K Cl 105, gly 129, cr 1.4 ( 1.2) EKG:nsr, Q in inferior lead Patient tx with epi, solumedral ,benadryl , pepcid  and TXA   Review of Systems: As per HPI otherwise 10 point review of systems negative.   Past Medical History:  Diagnosis Date   Hypertension     Past Surgical History:  Procedure Laterality Date   CORONARY STENT INTERVENTION N/A 08/03/2023   Procedure: CORONARY STENT INTERVENTION;  Surgeon: Jordan, Peter M, MD;  Location: [facility] INVASIVE CV LAB;  Service: Cardiovascular;  Laterality: N/A;   LEFT HEART CATH AND CORONARY ANGIOGRAPHY N/A 08/03/2023   Procedure: LEFT HEART CATH AND CORONARY ANGIOGRAPHY;  Surgeon: Jordan, Peter M, MD;  Location: [facility]  INVASIVE CV LAB;  Service: Cardiovascular;  Laterality: N/A;     reports that he has been smoking cigarettes. He has never used smokeless tobacco. He reports current alcohol use. He reports current drug use. Drug: Marijuana.  Allergies[1]  Family History  Problem Relation Age of Onset   Colon cancer Neg Hx    Esophageal cancer Neg Hx     Prior to Admission medications  Medication Sig Start Date End Date Taking? Authorizing Provider  acetaminophen  (TYLENOL ) 500 MG tablet Take 1,000 mg by mouth every 6 (six) hours as needed for mild pain (pain score 1-3) or headache.   Yes [provider]  amLODipine  (NORVASC ) 10 MG tablet TAKE ONE TABLET BY MOUTH ONE TIME DAILY 05/13/24  Yes Wyn Jackee VEAR Mickey., NP  aspirin  EC 81 MG tablet Take 1 tablet (81 mg total) by mouth daily. Swallow whole. 08/05/23  Yes Vicci Rollo SAUNDERS, PA-C  atorvastatin  (LIPITOR) 80 MG tablet TAKE ONE TABLET BY MOUTH ONE TIME DAILY 10/14/24  Yes Goodrich, Callie E, PA-C  esomeprazole (NEXIUM) 40 MG capsule Take 40 mg by mouth daily.   Yes [provider]  Ferrous Sulfate Dried (FERROUS SULFATE IRON PO) Take 1 tablet by mouth daily.   Yes [provider]  ibuprofen  (ADVIL ) 200 MG tablet Take 400-600 mg by mouth every 6 (six) hours as needed for mild pain (pain score 1-3).   Yes [provider]  losartan  (COZAAR ) 100 MG tablet  Take 1 tablet (100 mg total) by mouth daily. 09/21/23  Yes Wyn Jackee VEAR Mickey., NP  metoprolol  succinate (TOPROL  XL) 50 MG 24 hr tablet Take 1 tablet (50 mg total) by mouth daily. Take with or immediately following a meal. 01/23/24  Yes Branch, Ronal BRAVO, MD  Multiple Vitamin (MULTIVITAMIN) tablet Take 1 tablet by mouth daily.   Yes [provider]  nitroGLYCERIN  (NITROSTAT ) 0.4 MG SL tablet Place 1 tablet (0.4 mg total) under the tongue every 5 (five) minutes as needed for chest pain. DO NOT take within 24 hours of taking medications for erectile dysfunction 09/21/23  04/16/25 Yes Wyn Jackee VEAR Mickey., NP  tadalafil (CIALIS) 5 MG tablet Take 5 mg by mouth daily as needed for erectile dysfunction.   Yes [provider]  ticagrelor  (BRILINTA ) 90 MG TABS tablet TAKE ONE TABLET BY MOUTH TWICE A DAY 10/03/24  Yes Lelon Glendia DASEN, [place]    Physical Exam: Vitals:   12/31/24 0400 12/31/24 0445 12/31/24 0500 12/31/24 0621  BP: (!) 136/98 (!) 143/92 (!) 134/102   Pulse: 75 76 73 71  Resp: 18 12 16 16   Temp:      TempSrc:      SpO2: 95% 97% 100% 100%    Constitutional: NAD, calm, comfortable Vitals:   12/31/24 0400 12/31/24 0445 12/31/24 0500 12/31/24 0621  BP: (!) 136/98 (!) 143/92 (!) 134/102   Pulse: 75 76 73 71  Resp: 18 12 16 16   Temp:      TempSrc:      SpO2: 95% 97% 100% 100%   Eyes: PERRL, lids and conjunctivae normal ENMT: Mucous membranes are moist. Tongue enlarge not note able to assess oropharynx.Normal dentition.  Neck: normal, supple, no masses, no thyromegaly Respiratory: clear to auscultation bilaterally, no wheezing, no crackles. Normal respiratory effort. No accessory muscle use.  Cardiovascular: Regular rate and rhythm, no murmurs / rubs / gallops. No extremity edema. 2+ pedal pulses Abdomen: no tenderness, no masses palpated. No hepatosplenomegaly. Bowel sounds positive.  Musculoskeletal: no clubbing / cyanosis. No joint deformity upper and lower extremities. Good ROM, no contractures. Normal muscle tone.  Skin: no rashes, lesions, ulcers. No induration Neurologic: CN 2-12 grossly intact. Sensation intact, Strength 5/5 in all 4.  Psychiatric: Normal judgment and insight. Alert and oriented x 3. Normal mood.    Labs on Admission: I have personally reviewed following labs and imaging studies  CBC: Recent Labs  Lab 12/31/24 0341  WBC 8.3  NEUTROABS 4.1  HGB 14.3  HCT 40.8  MCV 89.9  PLT 291   Basic Metabolic Panel: Recent Labs  Lab 12/31/24 0341  NA 140  K 4.0  CL 105  CO2 22  GLUCOSE 129*  BUN 20  CREATININE  1.40*  CALCIUM  9.0   GFR: CrCl cannot be calculated (Unknown ideal weight.). Liver Function Tests: No results for input(s): AST, ALT, ALKPHOS, BILITOT, PROT, ALBUMIN in the last 168 hours. No results for input(s): LIPASE, AMYLASE in the last 168 hours. No results for input(s): AMMONIA in the last 168 hours. Coagulation Profile: No results for input(s): INR, PROTIME in the last 168 hours. Cardiac Enzymes: No results for input(s): CKTOTAL, CKMB, CKMBINDEX, TROPONINI in the last 168 hours. BNP (last 3 results) No results for input(s): PROBNP in the last 8760 hours. HbA1C: No results for input(s): HGBA1C in the last 72 hours. CBG: No results for input(s): GLUCAP in the last 168 hours. Lipid Profile: No results for input(s): CHOL, HDL, LDLCALC, TRIG, CHOLHDL, LDLDIRECT  in the last 72 hours. Thyroid Function Tests: No results for input(s): TSH, T4TOTAL, FREET4, T3FREE, THYROIDAB in the last 72 hours. Anemia Panel: No results for input(s): VITAMINB12, FOLATE, FERRITIN, TIBC, IRON, RETICCTPCT in the last 72 hours. Urine analysis:    Component Value Date/Time   COLORURINE YELLOW 03/14/2022 0252   APPEARANCEUR CLEAR 03/14/2022 0252   LABSPEC 1.020 03/14/2022 0252   PHURINE 5.0 03/14/2022 0252   GLUCOSEU NEGATIVE 03/14/2022 0252   HGBUR SMALL (A) 03/14/2022 0252   BILIRUBINUR NEGATIVE 03/14/2022 0252   KETONESUR NEGATIVE 03/14/2022 0252   PROTEINUR 30 (A) 03/14/2022 0252   NITRITE NEGATIVE 03/14/2022 0252   LEUKOCYTESUR NEGATIVE 03/14/2022 0252    Radiological Exams on Admission: No results found.  EKG: Independently reviewed.   Assessment/Plan  Facial swelling/tongue swelling concern for Angioedema vs allergy -admit to progressive care  - s/p epi,txa, benadryl /pepcid / txa in ed -continue on bendaryl ,pepcid  ,solumedrol De-escalate therapy as able  - d/c losartan   -refer to allergy as outpatient     Hypertension, uncontrolled  -place on hydralazine  for now -continue metoprolol  , amlopdine  Chest pain  Hx of CAD s/p MI s/p stent  -EKG unchanged from prior  -resume cardiac mediations  -morphine , nitroglycerin  prn chest pain  -repeat EKG, CE  -echo ordered   HLD -resume statin   GERD -continue ppi     DVT prophylaxis: heparin  Code Status: full/ as discussed per patient wishes in event of cardiac arrest  Family Communication:  Patient Contacts    Cornelio,Sharron Mother, Emergency Contact [phone] (Home Phone)   Disposition Plan: patient  expected to be admitted greater than 2 midnights  Consults called: ICU Admission status: progressive care   Camila DELENA Ned MD [facility]   If 7PM-7AM, please contact night-coverage [email or web address] Password [facility]  12/31/2024, 6:48 AM        [1] No Known Allergies  "

## 2024-12-31 NOTE — ED Notes (Signed)
 Patient arrives via [facility]link from [facility] for angioedema suspected from ace inhibitors. Patient received epi x2 at [facility] with minimal relief. Received TXA with improvement. Patient arrives on room air at 95%. Swelling to face improved with some tongue swelling noted.

## 2024-12-31 NOTE — ED Provider Notes (Signed)
 Care of patient received from prior provider at 4:30 AM, please see their note for complete H/P and care plan.  Reassessment: Patient transferred from [facility] for ICU consult.   Clinically stabilizing with treatment from prior provider. Consulted critical care team was going to evaluate at bedside. Reassessment: Critical care team evaluated bedside.  They feel patient is improving enough for floor level observation. Discussed with hospitalist.  Patient still having scratchy throat would feel more comfortable continuing to be observed.   Jerral Meth, MD 01/01/25 [phone]

## 2025-01-01 ENCOUNTER — Other Ambulatory Visit (HOSPITAL_COMMUNITY): Payer: Self-pay

## 2025-01-01 LAB — BASIC METABOLIC PANEL WITH GFR
Anion gap: 12 (ref 5–15)
BUN: 20 mg/dL (ref 6–20)
CO2: 22 mmol/L (ref 22–32)
Calcium: 9.3 mg/dL (ref 8.9–10.3)
Chloride: 104 mmol/L (ref 98–111)
Creatinine, Ser: 1.19 mg/dL (ref 0.61–1.24)
GFR, Estimated: >60 mL/min
Glucose, Bld: 159 mg/dL — ABNORMAL HIGH (ref 70–99)
Potassium: 4.5 mmol/L (ref 3.5–5.1)
Sodium: 138 mmol/L (ref 135–145)

## 2025-01-01 LAB — CBC
HCT: 40 % (ref 39.0–52.0)
Hemoglobin: 13.9 g/dL (ref 13.0–17.0)
MCH: 31.5 pg (ref 26.0–34.0)
MCHC: 34.8 g/dL (ref 30.0–36.0)
MCV: 90.7 fL (ref 80.0–100.0)
Platelets: 274 K/uL (ref 150–400)
RBC: 4.41 MIL/uL (ref 4.22–5.81)
RDW: 13.8 % (ref 11.5–15.5)
WBC: 16.8 K/uL — ABNORMAL HIGH (ref 4.0–10.5)
nRBC: 0 % (ref 0.0–0.2)

## 2025-01-01 MED ORDER — PREDNISONE 20 MG PO TABS
20.0000 mg | ORAL_TABLET | Freq: Every day | ORAL | 0 refills | Status: AC
  Filled 2025-01-01: qty 3, 3d supply, fill #0

## 2025-01-01 MED ORDER — HYDRALAZINE HCL 50 MG PO TABS
50.0000 mg | ORAL_TABLET | Freq: Three times a day (TID) | ORAL | 0 refills | Status: AC
  Filled 2025-01-01: qty 90, 30d supply, fill #0

## 2025-01-01 MED ORDER — HYDRALAZINE HCL 25 MG PO TABS
50.0000 mg | ORAL_TABLET | Freq: Three times a day (TID) | ORAL | Status: DC
  Filled 2025-01-01: qty 2

## 2025-01-01 MED ORDER — PREDNISONE 20 MG PO TABS: 20.0000 mg | ORAL_TABLET | Freq: Every day | ORAL | Status: DC

## 2025-01-01 MED ORDER — DIPHENHYDRAMINE HCL 25 MG PO TABS
25.0000 mg | ORAL_TABLET | Freq: Four times a day (QID) | ORAL | 0 refills | Status: AC | PRN
  Filled 2025-01-01: qty 15, 4d supply, fill #0

## 2025-01-01 MED ORDER — FAMOTIDINE 20 MG PO TABS
20.0000 mg | ORAL_TABLET | Freq: Every day | ORAL | Status: DC
  Administered 2025-01-01: 20 mg via ORAL
  Filled 2025-01-01: qty 1

## 2025-01-01 MED ORDER — FAMOTIDINE 20 MG PO TABS
20.0000 mg | ORAL_TABLET | Freq: Every day | ORAL | 0 refills | Status: AC
  Filled 2025-01-01: qty 7, 7d supply, fill #0

## 2025-01-01 NOTE — ED Notes (Signed)
 Pt leaving for home and pharmacy after paper work review with spouse. Pt is in no new onset distress at this time.

## 2025-01-01 NOTE — Progress Notes (Signed)
" ° °  Brief Progress Note   _____________________________________________________________________________________________________________  Patient Name: Glenn Weber Patient DOB: Dec 26, 1980 Date: @TODAY @      Data: 44 yo male currently awaiting admission to Progressive bed at [facility].    Action: Reached out to Dr. Jillian to inquire about the possibility of downgrading the patient to a telemetry or med-surg level of care.     Response:  Per Dr. Jillian, pt will be discharged today.  _____________________________________________________________________________________________________________  [facility] RN Expeditor Kamila Broda S Alfonzo Arca Please contact us  directly via secure chat (search for [facility]) or by calling us  at [phone] [facility]).  "

## 2025-01-01 NOTE — ED Notes (Signed)
 Pt ambulating to and from the bathroom independently. No signs of distress noted.

## 2025-01-01 NOTE — Discharge Summary (Signed)
 Physician Discharge Summary  [name] FMW:[id number] DOB: [birth year] DOA: 12/31/2024  PCP: [facility]  Admit date: 12/31/2024 Discharge date: 01/01/2025  Admitted From: Home Disposition:  Home  Discharge Condition:Stable CODE STATUS:FULL Diet recommendation: Heart Healthy  Brief/Interim Summary: Patient is a 44 year old male with history of hypertension, hyperlipidemia, GERD, coronary artery disease status post PCI with DES presented with facial swelling, tongue swelling, itching.  Benadryl  did not help.  Takes losartan  at home.  On prednisone , he was mildly hypertensive otherwise hemodynamically stable.  Angioedema was suspected and he was given Solu-Medrol , epi, Benadryl , Pepcid .  Angioedema resolved.  No tongue swelling today.  On room air.  Medically stable for discharge home today.  Following problems were addressed during the hospitalization:   Facial swelling/tongue swelling concern for Angioedema vs allergy  - s/p epi,txa, benadryl /pepcid / txa in ed -continue on bendaryl as needed,pepcid  , short course of prednisone  on discharge - d/c losartan       Hypertension, uncontrolled  - started on hydralazine  -continue metoprolol  , amlopdine   Chest pain  Hx of CAD s/p MI s/p stent  -EKG unchanged from prior  -resume cardiac mediations  - Echo showed normal EF, no wall motion abnormalities   HLD -resume statin    GERD -continue ppi   Discharge Diagnoses:  Principal Problem:   Facial swelling    Discharge Instructions  Discharge Instructions     Discharge instructions   Complete by: As directed    1)Please take your medications as instructed 2)Follow up with your PCP in a week 3)Stop losartan . Monitor your blood pressure at home   Increase activity slowly   Complete by: As directed       Allergies as of 01/01/2025       Reactions   Losartan  Anaphylaxis   Unable to determine cause of angioedema 1/14 ED visit, losartan  discontinued at that  time        Medication List     TAKE these medications    acetaminophen  500 MG tablet Commonly known as: TYLENOL  Take 1,000 mg by mouth every 6 (six) hours as needed for mild pain (pain score 1-3) or headache.   amLODipine  10 MG tablet Commonly known as: NORVASC  TAKE ONE TABLET BY MOUTH ONE TIME DAILY   aspirin  EC 81 MG tablet Take 1 tablet (81 mg total) by mouth daily. Swallow whole.   atorvastatin  80 MG tablet Commonly known as: LIPITOR TAKE ONE TABLET BY MOUTH ONE TIME DAILY   Brilinta  90 MG Tabs tablet Generic drug: ticagrelor  TAKE ONE TABLET BY MOUTH TWICE A DAY   diphenhydrAMINE  25 mg capsule Commonly known as: BENADRYL  Take 1 capsule (25 mg total) by mouth every 6 (six) hours as needed for itching or allergies.   esomeprazole 40 MG capsule Commonly known as: NEXIUM Take 40 mg by mouth daily.   famotidine  20 MG tablet Commonly known as: PEPCID  Take 1 tablet (20 mg total) by mouth daily.   FERROUS SULFATE IRON PO Take 1 tablet by mouth daily.   hydrALAZINE  50 MG tablet Commonly known as: APRESOLINE  Take 1 tablet (50 mg total) by mouth every 8 (eight) hours.   ibuprofen  200 MG tablet Commonly known as: ADVIL  Take 400-600 mg by mouth every 6 (six) hours as needed for mild pain (pain score 1-3).   metoprolol  succinate 50 MG 24 hr tablet Commonly known as: Toprol  XL Take 1 tablet (50 mg total) by mouth daily. Take with or immediately following a meal.   multivitamin tablet  Take 1 tablet by mouth daily.   nitroGLYCERIN  0.4 MG SL tablet Commonly known as: NITROSTAT  Place 1 tablet (0.4 mg total) under the tongue every 5 (five) minutes as needed for chest pain. DO NOT take within 24 hours of taking medications for erectile dysfunction   predniSONE  20 MG tablet Commonly known as: DELTASONE  Take 1 tablet (20 mg total) by mouth daily with breakfast. Start taking on: January 02, 2025   tadalafil 5 MG tablet Commonly known as: CIALIS Take 5 mg by mouth  daily as needed for erectile dysfunction.        Follow-up Information     [facility]. Schedule an appointment as soon as possible for a visit in 1 week(s).   Contact information: [address] [place] [address] [phone]                Allergies[1]  Consultations: PCCM   Procedures/Studies: ECHOCARDIOGRAM COMPLETE Result Date: 12/31/2024    ECHOCARDIOGRAM REPORT   Patient Name:   Glenn Weber Date of Exam: 12/31/2024 Medical Rec #:  [id number]      Height:       68.0 in Accession #:    [id number]     Weight:       187.0 lb Date of Birth:  1981/12/17     BSA:          1.986 m Patient Age:    43 years       BP:           150/94 mmHg Patient Gender: M              HR:           83 bpm. Exam Location:  Inpatient Procedure: 2D Echo, Cardiac Doppler, Color Doppler and Intracardiac            Opacification Agent (Both Spectral and Color Flow Doppler were            utilized during procedure). Indications:    Chest Pain  History:        Patient has prior history of Echocardiogram examinations, most                 recent 08/03/2023. Acute MI; Risk Factors:Hypertension.  Sonographer:    Sherlean Dubin Referring Phys: [id number] SARA-MAIZ A THOMAS  Sonographer Comments: Image acquisition challenging due to respiratory motion. IMPRESSIONS  1. Left ventricular ejection fraction, by estimation, is 70 to 75%. The left ventricle has hyperdynamic function. The left ventricle has no regional wall motion abnormalities. There is moderate concentric left ventricular hypertrophy. Left ventricular diastolic parameters are consistent with Grade I diastolic dysfunction (impaired relaxation).  2. Right ventricular systolic function is normal. The right ventricular size is normal.  3. The mitral valve is normal in structure. Trivial mitral valve regurgitation. No evidence of mitral stenosis.  4. The aortic valve is tricuspid. Aortic valve regurgitation is not visualized. No aortic stenosis is  present.  5. The inferior vena cava is normal in size with greater than 50% respiratory variability, suggesting right atrial pressure of 3 mmHg. FINDINGS  Left Ventricle: Left ventricular ejection fraction, by estimation, is 70 to 75%. The left ventricle has hyperdynamic function. The left ventricle has no regional wall motion abnormalities. Definity  contrast agent was given IV to delineate the left ventricular endocardial borders. The left ventricular internal cavity size was normal in size. There is moderate concentric left ventricular hypertrophy. Left ventricular diastolic parameters are consistent with Grade  I diastolic dysfunction (impaired relaxation). Right Ventricle: The right ventricular size is normal. Right ventricular systolic function is normal. Left Atrium: Left atrial size was normal in size. Right Atrium: Right atrial size was normal in size. Pericardium: There is no evidence of pericardial effusion. Mitral Valve: The mitral valve is normal in structure. Trivial mitral valve regurgitation. No evidence of mitral valve stenosis. Tricuspid Valve: The tricuspid valve is normal in structure. Tricuspid valve regurgitation is trivial. No evidence of tricuspid stenosis. Aortic Valve: The aortic valve is tricuspid. Aortic valve regurgitation is not visualized. No aortic stenosis is present. Aortic valve mean gradient measures 4.0 mmHg. Aortic valve peak gradient measures 7.7 mmHg. Aortic valve area, by VTI measures 2.37 cm. Pulmonic Valve: The pulmonic valve was normal in structure. Pulmonic valve regurgitation is not visualized. No evidence of pulmonic stenosis. Aorta: The aortic root is normal in size and structure. Venous: The inferior vena cava is normal in size with greater than 50% respiratory variability, suggesting right atrial pressure of 3 mmHg. IAS/Shunts: No atrial level shunt detected by color flow Doppler.  LEFT VENTRICLE PLAX 2D LVIDd:         4.40 cm   Diastology LVIDs:         2.60 cm   LV  e' medial:    6.96 cm/s LV PW:         1.40 cm   LV E/e' medial:  8.9 LV IVS:        1.40 cm   LV e' lateral:   11.10 cm/s LVOT diam:     2.00 cm   LV E/e' lateral: 5.6 LV SV:         58 LV SV Index:   29 LVOT Area:     3.14 cm  RIGHT VENTRICLE             IVC RV S prime:     15.80 cm/s  IVC diam: 2.00 cm TAPSE (M-mode): 1.7 cm LEFT ATRIUM           Index        RIGHT ATRIUM           Index LA diam:      2.60 cm 1.31 cm/m   RA Area:     16.30 cm LA Vol (A2C): 36.5 ml 18.38 ml/m  RA Volume:   36.10 ml  18.18 ml/m LA Vol (A4C): 41.0 ml 20.64 ml/m  AORTIC VALVE AV Area (Vmax):    2.31 cm AV Area (Vmean):   2.28 cm AV Area (VTI):     2.37 cm AV Vmax:           138.50 cm/s AV Vmean:          91.750 cm/s AV VTI:            0.246 m AV Peak Grad:      7.7 mmHg AV Mean Grad:      4.0 mmHg LVOT Vmax:         101.80 cm/s LVOT Vmean:        66.650 cm/s LVOT VTI:          0.185 m LVOT/AV VTI ratio: 0.75  AORTA Ao Root diam: 3.10 cm Ao Asc diam:  3.30 cm MITRAL VALVE               TRICUSPID VALVE MV Area (PHT): 3.77 cm    TR Peak grad:   18.3 mmHg MV Decel Time: 201 msec    TR  Vmax:        214.00 cm/s MR Peak grad: 7.1 mmHg MR Vmax:      133.00 cm/s  SHUNTS MV E velocity: 61.70 cm/s  Systemic VTI:  0.18 m MV A velocity: 68.20 cm/s  Systemic Diam: 2.00 cm MV E/A ratio:  0.90 Redell Shallow MD Electronically signed by Redell Shallow MD Signature Date/Time: 12/31/2024/2:26:10 PM    Final       Subjective: Patient seen and examined at bedside today.  Hemodynamically stable.  Comfortable.  Lying in bed.  Mildly hypertensive today.  No tongue swelling this morning.  Lungs clear on auscultation.  Medically stable for discharge home today.  Discharge Exam: Vitals:   01/01/25 0542 01/01/25 1026  BP:  (!) 137/99  Pulse:    Resp:    Temp: 98.7 F (37.1 C)   SpO2:     Vitals:   01/01/25 0200 01/01/25 0500 01/01/25 0542 01/01/25 1026  BP: (!) 153/98 (!) 142/97  (!) 137/99  Pulse: 84 80    Resp: 15 12    Temp:    98.7 F (37.1 C)   TempSrc:   Oral   SpO2: 97% 99%      General: Pt is alert, awake, not in acute distress Cardiovascular: RRR, S1/S2 +, no rubs, no gallops Respiratory: CTA bilaterally, no wheezing, no rhonchi Abdominal: Soft, NT, ND, bowel sounds + Extremities: no edema, no cyanosis    The results of significant diagnostics from this hospitalization (including imaging, microbiology, ancillary and laboratory) are listed below for reference.     Microbiology: No results found for this or any previous visit (from the past 240 hours).   Labs: BNP (last 3 results) No results for input(s): BNP in the last 8760 hours. Basic Metabolic Panel: Recent Labs  Lab 12/31/24 0341 12/31/24 0958 01/01/25 0538  NA 140 136 138  K 4.0 4.7 4.5  CL 105 103 104  CO2 22 22 22   GLUCOSE 129* 215* 159*  BUN 20 20 20   CREATININE 1.40* 1.37* 1.19  CALCIUM  9.0 9.1 9.3  MG  --  1.9  --    Liver Function Tests: Recent Labs  Lab 12/31/24 0958  AST 100*  ALT 31  ALKPHOS 92  BILITOT 0.4  PROT 7.2  ALBUMIN 4.4   No results for input(s): LIPASE, AMYLASE in the last 168 hours. No results for input(s): AMMONIA in the last 168 hours. CBC: Recent Labs  Lab 12/31/24 0341 01/01/25 0538  WBC 8.3 16.8*  NEUTROABS 4.1  --   HGB 14.3 13.9  HCT 40.8 40.0  MCV 89.9 90.7  PLT 291 274   Cardiac Enzymes: No results for input(s): CKTOTAL, CKMB, CKMBINDEX, TROPONINI in the last 168 hours. BNP: Invalid input(s): POCBNP CBG: No results for input(s): GLUCAP in the last 168 hours. D-Dimer No results for input(s): DDIMER in the last 72 hours. Hgb A1c No results for input(s): HGBA1C in the last 72 hours. Lipid Profile Recent Labs    12/31/24 0958  CHOL 97  HDL 52  LDLCALC 26  TRIG 92  CHOLHDL 1.9   Thyroid function studies Recent Labs    12/31/24 0958  TSH 0.406   Anemia work up No results for input(s): VITAMINB12, FOLATE, FERRITIN, TIBC, IRON,  RETICCTPCT in the last 72 hours. Urinalysis    Component Value Date/Time   COLORURINE YELLOW 03/14/2022 0252   APPEARANCEUR CLEAR 03/14/2022 0252   LABSPEC 1.020 03/14/2022 0252   PHURINE 5.0 03/14/2022 0252   GLUCOSEU NEGATIVE  03/14/2022 0252   HGBUR SMALL (A) 03/14/2022 0252   BILIRUBINUR NEGATIVE 03/14/2022 0252   KETONESUR NEGATIVE 03/14/2022 0252   PROTEINUR 30 (A) 03/14/2022 0252   NITRITE NEGATIVE 03/14/2022 0252   LEUKOCYTESUR NEGATIVE 03/14/2022 0252   Sepsis Labs Recent Labs  Lab 12/31/24 0341 01/01/25 0538  WBC 8.3 16.8*   Microbiology No results found for this or any previous visit (from the past 240 hours).  Please note: You were cared for by a hospitalist during your hospital stay. Once you are discharged, your primary care physician will handle any further medical issues. Please note that NO REFILLS for any discharge medications will be authorized once you are discharged, as it is imperative that you return to your primary care physician (or establish a relationship with a primary care physician if you do not have one) for your post hospital discharge needs so that they can reassess your need for medications and monitor your lab values.    Time coordinating discharge: 40 minutes  SIGNED:   Ivonne Mustache, MD  [facility] 01/01/2025, 10:27 AM Pager [phone]  If 7PM-7AM, please contact night-coverage [email or web address] Password [email or web address]    [1]  Allergies Allergen Reactions   Losartan  Anaphylaxis    Unable to determine cause of angioedema 1/14 ED visit, losartan  discontinued at that time
